# Patient Record
Sex: Female | Born: 1995 | Race: White | Hispanic: No | Marital: Married | Smoking: Former smoker
Health system: Southern US, Community
[De-identification: ages and names within clinical notes are randomized; demographics above are authoritative.]

## PROBLEM LIST (undated history)

## (undated) ENCOUNTER — Inpatient Hospital Stay (HOSPITAL_COMMUNITY): Payer: Self-pay

## (undated) DIAGNOSIS — L409 Psoriasis, unspecified: Secondary | ICD-10-CM

## (undated) DIAGNOSIS — K219 Gastro-esophageal reflux disease without esophagitis: Secondary | ICD-10-CM

## (undated) DIAGNOSIS — K8050 Calculus of bile duct without cholangitis or cholecystitis without obstruction: Secondary | ICD-10-CM

## (undated) DIAGNOSIS — Z789 Other specified health status: Secondary | ICD-10-CM

## (undated) DIAGNOSIS — O1490 Unspecified pre-eclampsia, unspecified trimester: Secondary | ICD-10-CM

## (undated) HISTORY — PX: NO PAST SURGERIES: SHX2092

## (undated) HISTORY — PX: GALLBLADDER SURGERY: SHX652

---
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
## 2014-04-08 DIAGNOSIS — L409 Psoriasis, unspecified: Secondary | ICD-10-CM | POA: Insufficient documentation

## 2014-06-13 ENCOUNTER — Ambulatory Visit: Payer: Self-pay | Admitting: Internal Medicine

## 2014-07-28 ENCOUNTER — Emergency Department: Payer: Self-pay | Admitting: Emergency Medicine

## 2014-08-14 LAB — OB RESULTS CONSOLE GC/CHLAMYDIA
Chlamydia: POSITIVE
Gonorrhea: NEGATIVE

## 2014-08-23 ENCOUNTER — Emergency Department: Payer: Self-pay | Admitting: Emergency Medicine

## 2014-08-23 LAB — COMPREHENSIVE METABOLIC PANEL
ALK PHOS: 51 U/L
ALT: 14 U/L
Albumin: 3.3 g/dL — ABNORMAL LOW (ref 3.8–5.6)
Anion Gap: 9 (ref 7–16)
BUN: 4 mg/dL — AB (ref 7–18)
Bilirubin,Total: 0.5 mg/dL (ref 0.2–1.0)
CHLORIDE: 106 mmol/L (ref 98–107)
CO2: 24 mmol/L (ref 21–32)
Calcium, Total: 8.7 mg/dL — ABNORMAL LOW (ref 9.0–10.7)
Creatinine: 0.58 mg/dL — ABNORMAL LOW (ref 0.60–1.30)
EGFR (Non-African Amer.): >60
GLUCOSE: 78 mg/dL (ref 65–99)
Osmolality: 273 (ref 275–301)
Potassium: 3.4 mmol/L — ABNORMAL LOW (ref 3.5–5.1)
SGOT(AST): 26 U/L (ref 0–26)
Sodium: 139 mmol/L (ref 136–145)
Total Protein: 7.2 g/dL (ref 6.4–8.6)

## 2014-08-23 LAB — CBC WITH DIFFERENTIAL/PLATELET
BASOS PCT: 0.3 %
Basophil #: 0 10*3/uL (ref 0.0–0.1)
EOS PCT: 1.5 %
Eosinophil #: 0.2 10*3/uL (ref 0.0–0.7)
HCT: 37.8 % (ref 35.0–47.0)
HGB: 12.8 g/dL (ref 12.0–16.0)
Lymphocyte #: 1.9 10*3/uL (ref 1.0–3.6)
Lymphocyte %: 18.5 %
MCH: 30.1 pg (ref 26.0–34.0)
MCHC: 33.8 g/dL (ref 32.0–36.0)
MCV: 89 fL (ref 80–100)
Monocyte #: 0.8 x10 3/mm (ref 0.2–0.9)
Monocyte %: 7.7 %
NEUTROS PCT: 72 %
Neutrophil #: 7.5 10*3/uL — ABNORMAL HIGH (ref 1.4–6.5)
Platelet: 233 10*3/uL (ref 150–440)
RBC: 4.25 10*6/uL (ref 3.80–5.20)
RDW: 14 % (ref 11.5–14.5)
WBC: 10.4 10*3/uL (ref 3.6–11.0)

## 2014-08-23 LAB — URINALYSIS, COMPLETE
Bacteria: NONE SEEN
Bilirubin,UR: NEGATIVE
Blood: NEGATIVE
Glucose,UR: NEGATIVE mg/dL (ref 0–75)
KETONE: NEGATIVE
NITRITE: NEGATIVE
PH: 7 (ref 4.5–8.0)
Protein: NEGATIVE
Specific Gravity: 1.002 (ref 1.003–1.030)
Squamous Epithelial: 3

## 2014-08-23 LAB — GC/CHLAMYDIA PROBE AMP

## 2014-08-23 LAB — WET PREP, GENITAL

## 2014-08-23 LAB — HCG, QUANTITATIVE, PREGNANCY: BETA HCG, QUANT.: 100087 m[IU]/mL — AB

## 2014-08-23 NOTE — L&D Delivery Note (Signed)
Operative Delivery Note Patient pushed for 2 hours after she was noted to be C/C/+2.  Discussed with patient performing an operative delivery as she had verbalized exhaustion.Verbal consent: obtained from patient.  Risks and benefits discussed in detail.  Risks include, but are not limited to the risks of anesthesia, bleeding, infection, damage to maternal tissues, fetal cephalhematoma.  There is also the risk of inability to effect vaginal delivery of the head, or shoulder dystocia that cannot be resolved by established maneuvers, leading to the need for emergency cesarean section. Epidural was adequate, foley bulb had been just removed, so bladder was empty.  Baby was noted to be ROT.  Kiwi vacuum placed 3 cm midline from anterior fontanelle. 3 pulls were performed With maternal effort and three pop offs occurred.  An episiotomy was performed and with maternal effort, the baby was born.  At 7:48 AM a viable and healthy female was delivered via Vaginal, Vacuum Investment banker, operational(Extractor).  Presentation: vertex; Position: Left,, Occiput,, Transverse   APGAR: 8, 9; weight 7 lb 9.5 oz (3445 g).   Placenta status: Intact, Spontaneous.   Cord: 3 vessels with the following complications: None.    Anesthesia: Epidural  Instruments: Kiwi vacuum Episiotomy: Median Lacerations: 2nd degree Suture Repair: 2.0 vicryl Est. Blood Loss (mL): 89  Mom to postpartum.  Baby to Couplet care / Skin to Skin.  [name], [name] 03/09/2015, 2:42 PM

## 2014-09-12 LAB — OB RESULTS CONSOLE RUBELLA ANTIBODY, IGM: Rubella: IMMUNE

## 2014-09-12 LAB — OB RESULTS CONSOLE ABO/RH: RH Type: POSITIVE

## 2014-09-12 LAB — OB RESULTS CONSOLE ANTIBODY SCREEN: ANTIBODY SCREEN: NEGATIVE

## 2014-09-12 LAB — OB RESULTS CONSOLE HEPATITIS B SURFACE ANTIGEN: Hepatitis B Surface Ag: NEGATIVE

## 2014-09-12 LAB — OB RESULTS CONSOLE HIV ANTIBODY (ROUTINE TESTING): HIV: NONREACTIVE

## 2014-09-12 LAB — OB RESULTS CONSOLE RPR: RPR: NONREACTIVE

## 2014-11-02 ENCOUNTER — Observation Stay: Payer: Self-pay | Admitting: Obstetrics and Gynecology

## 2014-12-13 ENCOUNTER — Ambulatory Visit
Admit: 2014-12-13 | Disposition: A | Discharge status: Home / Self Care | Payer: Self-pay | Attending: Family Medicine | Admitting: Family Medicine

## 2014-12-13 LAB — RAPID INFLUENZA A&B ANTIGENS

## 2014-12-13 LAB — RAPID STREP-A WITH REFLX: Micro Text Report: NEGATIVE

## 2014-12-16 LAB — BETA STREP CULTURE(ARMC)

## 2014-12-20 ENCOUNTER — Inpatient Hospital Stay (HOSPITAL_COMMUNITY)
Admission: AD | Admit: 2014-12-20 | Discharge: 2014-12-21 | Disposition: A | Discharge status: Home / Self Care | Payer: Medicaid Other | Source: Ambulatory Visit | Attending: Obstetrics | Admitting: Obstetrics

## 2014-12-20 ENCOUNTER — Encounter (HOSPITAL_COMMUNITY): Payer: Self-pay | Admitting: *Deleted

## 2014-12-20 DIAGNOSIS — O219 Vomiting of pregnancy, unspecified: Secondary | ICD-10-CM | POA: Diagnosis not present

## 2014-12-20 DIAGNOSIS — Z3A27 27 weeks gestation of pregnancy: Secondary | ICD-10-CM | POA: Insufficient documentation

## 2014-12-20 DIAGNOSIS — Z87891 Personal history of nicotine dependence: Secondary | ICD-10-CM | POA: Insufficient documentation

## 2014-12-20 DIAGNOSIS — R109 Unspecified abdominal pain: Secondary | ICD-10-CM | POA: Insufficient documentation

## 2014-12-20 DIAGNOSIS — O210 Mild hyperemesis gravidarum: Secondary | ICD-10-CM | POA: Diagnosis not present

## 2014-12-20 DIAGNOSIS — O26899 Other specified pregnancy related conditions, unspecified trimester: Secondary | ICD-10-CM

## 2014-12-20 DIAGNOSIS — Z3A28 28 weeks gestation of pregnancy: Secondary | ICD-10-CM | POA: Diagnosis not present

## 2014-12-20 LAB — URINALYSIS, ROUTINE W REFLEX MICROSCOPIC
Bilirubin Urine: NEGATIVE
GLUCOSE, UA: NEGATIVE mg/dL
Hgb urine dipstick: NEGATIVE
KETONES UR: NEGATIVE mg/dL
NITRITE: NEGATIVE
Protein, ur: NEGATIVE mg/dL
Specific Gravity, Urine: >1.03 — ABNORMAL HIGH (ref 1.005–1.030)
Urobilinogen, UA: 0.2 mg/dL (ref 0.0–1.0)
pH: 6 (ref 5.0–8.0)

## 2014-12-20 LAB — URINE MICROSCOPIC-ADD ON

## 2014-12-20 MED ORDER — LACTATED RINGERS IV SOLN
INTRAVENOUS | Status: DC
  Administered 2014-12-20: 23:00:00 via INTRAVENOUS

## 2014-12-20 MED ORDER — FAMOTIDINE IN NACL 20-0.9 MG/50ML-% IV SOLN
20.0000 mg | Freq: Once | INTRAVENOUS | Status: AC
  Administered 2014-12-20: 20 mg via INTRAVENOUS
  Filled 2014-12-20: qty 50

## 2014-12-20 MED ORDER — PROMETHAZINE HCL 25 MG/ML IJ SOLN
25.0000 mg | Freq: Once | INTRAMUSCULAR | Status: AC
  Administered 2014-12-20: 25 mg via INTRAVENOUS
  Filled 2014-12-20: qty 1

## 2014-12-20 NOTE — MAU Provider Note (Signed)
History     CSN: [id number]  Arrival date and time: 12/20/14 2201   None     Chief Complaint  Patient presents with  . Abdominal Cramping  . Nausea   HPI   Ms Judith Mckinney is a 19 y.o. female who presents with abdominal cramping and N/V that started today. The cramping started at noon; she drank a bottle of water and laid down and felt much better after that. Around 5:00 pm she felt nauseated and has vomited 4-5 times since then. She has not been around anyone who is sick. She has not felt like eating since vomiting, she has been able to drink water. Denies fever. Denies diarrhea. The last time she vomited was 9; she has not taken anything for nausea today. She has had N/V throughout her pregnancy, but not this bad.   + fetal movements Denies vaginal bleeding No intercourse in the last 24 hours.   OB History    Gravida Para Term Preterm AB TAB SAB Ectopic Multiple Living   1                 History reviewed. No pertinent past medical history.  History reviewed. No pertinent past surgical history.  History reviewed. No pertinent family history.  History  Substance Use Topics  . Smoking status: Former Games developer  . Smokeless tobacco: Not on file  . Alcohol Use: No    Allergies:  Allergies  Allergen Reactions  . Neosporin [Neomycin-Bacitracin Zn-Polymyx] Rash    Prescriptions prior to admission  Medication Sig Dispense Refill Last Dose  . Doxylamine-Pyridoxine (DICLEGIS PO) Take by mouth.   12/19/2014 at Unknown time  . Prenatal Vit-Fe Fumarate-FA (PRENATAL MULTIVITAMIN) TABS tablet Take 1 tablet by mouth daily at 12 noon.      Results for orders placed or performed during the hospital encounter of 12/20/14 (from the past 48 hour(s))  Urinalysis, Routine w reflex microscopic     Status: Abnormal   Collection Time: 12/20/14 10:05 PM  Result Value Ref Range   Color, Urine YELLOW YELLOW   APPearance CLEAR CLEAR   Specific Gravity, Urine >1.030 (H) 1.005 - 1.030    pH 6.0 5.0 - 8.0   Glucose, UA NEGATIVE NEGATIVE mg/dL   Hgb urine dipstick NEGATIVE NEGATIVE   Bilirubin Urine NEGATIVE NEGATIVE   Ketones, ur NEGATIVE NEGATIVE mg/dL   Protein, ur NEGATIVE NEGATIVE mg/dL   Urobilinogen, UA 0.2 0.0 - 1.0 mg/dL   Nitrite NEGATIVE NEGATIVE   Leukocytes, UA TRACE (A) NEGATIVE  Urine microscopic-add on     Status: Abnormal   Collection Time: 12/20/14 10:05 PM  Result Value Ref Range   Squamous Epithelial / LPF FEW (A) RARE   WBC, UA 3-6 <3 WBC/hpf   Bacteria, UA FEW (A) RARE   Crystals CA OXALATE CRYSTALS (A) NEGATIVE   Urine-Other MUCOUS PRESENT     Review of Systems  Constitutional: Negative for fever and chills.  Gastrointestinal: Positive for heartburn, nausea, vomiting and abdominal pain (lower abdominal cramping, feels like period cramps). Negative for diarrhea and constipation.  Genitourinary: Negative for dysuria.   Physical Exam   Blood pressure 126/66, pulse 83, temperature 97.5 F (36.4 C), resp. rate 18, height 5' 6.5" (1.689 m), weight 85.186 kg (187 lb 12.8 oz).  Physical Exam  Constitutional: She is oriented to person, place, and time. She appears well-developed and well-nourished.  Non-toxic appearance. She does not have a sickly appearance. She does not appear ill. No distress.  Eyes:  Pupils are equal, round, and reactive to light.  Neck: Neck supple.  Respiratory: Effort normal.  Genitourinary:  Cervix closed, thick, posterior   Musculoskeletal: Normal range of motion.  Neurological: She is alert and oriented to person, place, and time.  Skin: Skin is warm. She is not diaphoretic.  Psychiatric: Her behavior is normal.     Fetal Tracing: Baseline: 140 Variability: moderate  Accelerations: 15x15 Decelerations: none Toco: Quiet   MAU Course  Procedures  None  MDM  1100: Discussed labs, fetal tracing and cervical exam with Dr. Chestine Sporelark who agrees with plan of care.  LR bolus Phenergan 25 mg IV Pepcid 20 mg IV    Assessment and Plan   A:  1. Nausea and vomiting in pregnancy   2. Abdominal cramping affecting pregnancy     P:  Discharge home in stable condition RX: phenergan and Zofran Increase fluid intake Rest Follow up with OB as scheduled, or soon if needed     Duane LopeJennifer I Rasch, NP 12/20/2014 11:08 PM

## 2014-12-20 NOTE — MAU Note (Addendum)
Felt really crampy earlier today. Got better for awhile after drinking water. Feel hot and can't keep myself cool. Seeing spots. Thrown up about 5 times. Feel dehydrated. No diarrhea

## 2014-12-21 MED ORDER — PROMETHAZINE HCL 12.5 MG PO TABS: 12.5000 mg | ORAL_TABLET | Freq: Four times a day (QID) | ORAL | Status: DC | PRN

## 2014-12-21 MED ORDER — ONDANSETRON 4 MG PO TBDP: 4.0000 mg | ORAL_TABLET | Freq: Three times a day (TID) | ORAL | Status: DC | PRN

## 2014-12-31 NOTE — H&P (Signed)
L&D Evaluation:  History:  HPI Pt is a 19 yo G1 at 22.[redacted] weeks GA with an EDC of 03/08/15 [facility] [place], presents to L&D with reports of nausea, vomiting, and dizziness. She reports feeling nauseated and vomiting throughout the pregnancy but reports that she started throwing up bile and felt dizzy so [facility]. She reports +FM, denies vb, lof, ctx, back pain, cva tenderness, urinary frequency. She denies any problems with her pregnancy. She has a history of chlamydia this pregnancy with a negative TOC and was a former smoker but quit upon +HPT.   Presents with nausea/vomiting   Patient's Medical History No Chronic Illness  chlamydia   Patient's Surgical History scope to diagnose a possible torn labrum   Medications Pre Natal Vitamins  phenergan 12.5   Allergies other, neosporin, adhesives   Social History former tobacco   Family History Non-Contributory   ROS:  ROS All systems were reviewed.  HEENT, CNS, GI, GU, Respiratory, CV, Renal and Musculoskeletal systems were found to be normal.   Exam:  Vital Signs stable   General no apparent distress   Mental Status clear   Chest clear   Heart normal sinus rhythm   Abdomen gravid, non-tender   Mebranes Intact   FHT normal rate with no decels, normal for gestational age   Ucx absent   Skin dry, no lesions, no rashes   Lymph no lymphadenopathy   Other UA- negative nitrities, ketones, protein, bood. specific gravity 1.005, 3+ leukocytes, 10 WBCs, trace bacteria, 4 epis   Impression:  Impression UTI, IUP at 22.1 weeks, UTI, nausea and vomiting   Plan:  Plan discharge, abx rx given to pt. Pt told to f/u with OB.   Follow Up Appointment already scheduled   Electronic Signatures: Jannet MantisSubudhi, Ameri Cahoon (CNM)  (Signed 13-Mar-16 03:30)  Authored: L&D Evaluation   Last Updated: 13-Mar-16 03:30 by Jannet MantisSubudhi, Yaritzel Stange (CNM)

## 2015-01-27 ENCOUNTER — Encounter (HOSPITAL_COMMUNITY): Payer: Self-pay | Admitting: *Deleted

## 2015-01-27 ENCOUNTER — Inpatient Hospital Stay (HOSPITAL_COMMUNITY): Payer: Medicaid Other

## 2015-01-27 ENCOUNTER — Inpatient Hospital Stay (HOSPITAL_COMMUNITY)
Admission: AD | Admit: 2015-01-27 | Discharge: 2015-01-27 | Disposition: A | Discharge status: Home / Self Care | Payer: Medicaid Other | Source: Ambulatory Visit | Attending: Obstetrics and Gynecology | Admitting: Obstetrics and Gynecology

## 2015-01-27 DIAGNOSIS — W19XXXA Unspecified fall, initial encounter: Secondary | ICD-10-CM | POA: Diagnosis not present

## 2015-01-27 DIAGNOSIS — Z87891 Personal history of nicotine dependence: Secondary | ICD-10-CM | POA: Insufficient documentation

## 2015-01-27 DIAGNOSIS — R1030 Lower abdominal pain, unspecified: Secondary | ICD-10-CM | POA: Diagnosis present

## 2015-01-27 DIAGNOSIS — O26891 Other specified pregnancy related conditions, first trimester: Secondary | ICD-10-CM

## 2015-01-27 DIAGNOSIS — O9989 Other specified diseases and conditions complicating pregnancy, childbirth and the puerperium: Secondary | ICD-10-CM | POA: Diagnosis not present

## 2015-01-27 DIAGNOSIS — W109XXA Fall (on) (from) unspecified stairs and steps, initial encounter: Secondary | ICD-10-CM | POA: Diagnosis not present

## 2015-01-27 DIAGNOSIS — Z3A33 33 weeks gestation of pregnancy: Secondary | ICD-10-CM | POA: Insufficient documentation

## 2015-01-27 HISTORY — DX: Psoriasis, unspecified: L40.9

## 2015-01-27 NOTE — MAU Provider Note (Signed)
History     CSN: [id number]  Arrival date and time: 01/27/15 2056   First Provider Initiated Contact with Patient 01/27/15 2211      No chief complaint on file.  HPI Comments: Judith Mckinney is a 19 y.o. G1P0 at 2788w4d who presents today after a fall. She states that around 1930 she slipped while going down two steps and landed on her buttocks. She reports a "few random twinges of lower abdominal pain". She denies any vaginal bleeding or LOF. She states that initially the fetus had not moved much since the fall. She states that since being here, and on the monitor she has been feeling normal fetal movement. Her next appointment is on 02/05/15. She is being seen tomorrow for labs for cholestasis due to itching on her hands and feet.   Fall The accident occurred 1 to 3 hours ago. The fall occurred while walking (down two steps ). She fell from a height of 3 to 5 ft. She landed on hard floor. There was no blood loss. The point of impact was the buttocks (possibly hit back too). Pertinent negatives include no abdominal pain, nausea or vomiting. She has tried nothing for the symptoms.    Past Medical History  Diagnosis Date  . Psoriasis     History reviewed. No pertinent past surgical history.  History reviewed. No pertinent family history.  History  Substance Use Topics  . Smoking status: Former Games developermoker  . Smokeless tobacco: Not on file  . Alcohol Use: No    Allergies:  Allergies  Allergen Reactions  . Neosporin [Neomycin-Bacitracin Zn-Polymyx] Rash    Prescriptions prior to admission  Medication Sig Dispense Refill Last Dose  . calcium carbonate (TUMS - DOSED IN MG ELEMENTAL CALCIUM) 500 MG chewable tablet Chew 1-2 tablets by mouth daily as needed for heartburn.   Past Week at Unknown time  . Prenatal Vit-Fe Fumarate-FA (PRENATAL MULTIVITAMIN) TABS tablet Take 1 tablet by mouth daily at 12 noon.   01/27/2015 at Unknown time  . promethazine (PHENERGAN) 12.5 MG tablet Take 1 tablet  (12.5 mg total) by mouth every 6 (six) hours as needed for nausea or vomiting. 30 tablet 0 Past Week at Unknown time  . ondansetron (ZOFRAN ODT) 4 MG disintegrating tablet Take 1 tablet (4 mg total) by mouth every 8 (eight) hours as needed for nausea or vomiting. (Patient not taking: Reported on 01/27/2015) 20 tablet 0 Not Taking at Unknown time    Review of Systems  Gastrointestinal: Negative for nausea, vomiting, abdominal pain, diarrhea and constipation.  Musculoskeletal: Positive for falls. Negative for back pain.  Neurological: Negative for dizziness.   Physical Exam   Blood pressure 129/72, pulse 77, temperature 98.5 F (36.9 C), temperature source Oral, resp. rate 16, height 5\' 6"  (1.676 m), weight 88.905 kg (196 lb), SpO2 99 %.  Physical Exam  Nursing note and vitals reviewed. Constitutional: She is oriented to person, place, and time. She appears well-developed and well-nourished. No distress.  Cardiovascular: Normal rate.   Respiratory: Effort normal.  GI: Soft. There is no tenderness. There is no rebound.  Neurological: She is alert and oriented to person, place, and time.  Skin: Skin is warm and dry.  Psychiatric: She has a normal mood and affect.   FHT 145, moderate with 15x15 accels, no decels Toco: rare UC  MAU Course  Procedures  MDM US: NO previa or abruption identified, normal AFI  2326: d/w Dr. Dareen PianoAnderson, ok for dc home.   Assessment  and Plan   1. Fall, initial encounter   2. [redacted] weeks gestation of pregnancy    DC home Comfort measures reviewed  3rd Trimester precautions  PTL precautions  Fetal kick counts RX: none  Return to MAU as needed FU with OB as planned  Follow-up Information    Follow up with Levi Aland, MD.   Specialty:  Obstetrics and Gynecology   Why:  As scheduled   Contact information:   [address] [place] [address] [phone]         Judith Mckinney 01/27/2015, 10:13 PM

## 2015-01-27 NOTE — Discharge Instructions (Signed)
Third Trimester of Pregnancy The third trimester is from week 29 through week 42, months 7 through 9. The third trimester is a time when the fetus is growing rapidly. At the end of the ninth month, the fetus is about 20 inches in length and weighs 6-10 pounds.  BODY CHANGES Your body goes through many changes during pregnancy. The changes vary from woman to woman.   Your weight will continue to increase. You can expect to gain 25-35 pounds (11-16 kg) by the end of the pregnancy.  You may begin to get stretch marks on your hips, abdomen, and breasts.  You may urinate more often because the fetus is moving lower into your pelvis and pressing on your bladder.  You may develop or continue to have heartburn as a result of your pregnancy.  You may develop constipation because certain hormones are causing the muscles that push waste through your intestines to slow down.  You may develop hemorrhoids or swollen, bulging veins (varicose veins).  You may have pelvic pain because of the weight gain and pregnancy hormones relaxing your joints between the bones in your pelvis. Backaches may result from overexertion of the muscles supporting your posture.  You may have changes in your hair. These can include thickening of your hair, rapid growth, and changes in texture. Some women also have hair loss during or after pregnancy, or hair that feels dry or thin. Your hair will most likely return to normal after your baby is born.  Your breasts will continue to grow and be tender. A yellow discharge may leak from your breasts called colostrum.  Your belly button may stick out.  You may feel short of breath because of your expanding uterus.  You may notice the fetus "dropping," or moving lower in your abdomen.  You may have a bloody mucus discharge. This usually occurs a few days to a week before labor begins.  Your cervix becomes thin and soft (effaced) near your due date. WHAT TO EXPECT AT YOUR PRENATAL  EXAMS  You will have prenatal exams every 2 weeks until week 36. Then, you will have weekly prenatal exams. During a routine prenatal visit:  You will be weighed to make sure you and the fetus are growing normally.  Your blood pressure is taken.  Your abdomen will be measured to track your baby's growth.  The fetal heartbeat will be listened to.  Any test results from the previous visit will be discussed.  You may have a cervical check near your due date to see if you have effaced. At around 36 weeks, your caregiver will check your cervix. At the same time, your caregiver will also perform a test on the secretions of the vaginal tissue. This test is to determine if a type of bacteria, Group B streptococcus, is present. Your caregiver will explain this further. Your caregiver may ask you:  What your birth plan is.  How you are feeling.  If you are feeling the baby move.  If you have had any abnormal symptoms, such as leaking fluid, bleeding, severe headaches, or abdominal cramping.  If you have any questions. Other tests or screenings that may be performed during your third trimester include:  Blood tests that check for low iron levels (anemia).  Fetal testing to check the health, activity level, and growth of the fetus. Testing is done if you have certain medical conditions or if there are problems during the pregnancy. FALSE LABOR You may feel small, irregular contractions that   eventually go away. These are called Braxton Hicks contractions, or false labor. Contractions may last for hours, days, or even weeks before true labor sets in. If contractions come at regular intervals, intensify, or become painful, it is best to be seen by your caregiver.  SIGNS OF LABOR   Menstrual-like cramps.  Contractions that are 5 minutes apart or less.  Contractions that start on the top of the uterus and spread down to the lower abdomen and back.  A sense of increased pelvic pressure or back  pain.  A watery or bloody mucus discharge that comes from the vagina. If you have any of these signs before the 37th week of pregnancy, call your caregiver right away. You need to go to the hospital to get checked immediately. HOME CARE INSTRUCTIONS   Avoid all smoking, herbs, alcohol, and unprescribed drugs. These chemicals affect the formation and growth of the baby.  Follow your caregiver's instructions regarding medicine use. There are medicines that are either safe or unsafe to take during pregnancy.  Exercise only as directed by your caregiver. Experiencing uterine cramps is a good sign to stop exercising.  Continue to eat regular, healthy meals.  Wear a good support bra for breast tenderness.  Do not use hot tubs, steam rooms, or saunas.  Wear your seat belt at all times when driving.  Avoid raw meat, uncooked cheese, cat litter boxes, and soil used by cats. These carry germs that can cause birth defects in the baby.  Take your prenatal vitamins.  Try taking a stool softener (if your caregiver approves) if you develop constipation. Eat more high-fiber foods, such as fresh vegetables or fruit and whole grains. Drink plenty of fluids to keep your urine clear or pale yellow.  Take warm sitz baths to soothe any pain or discomfort caused by hemorrhoids. Use hemorrhoid cream if your caregiver approves.  If you develop varicose veins, wear support hose. Elevate your feet for 15 minutes, 3-4 times a day. Limit salt in your diet.  Avoid heavy lifting, wear low heal shoes, and practice good posture.  Rest a lot with your legs elevated if you have leg cramps or low back pain.  Visit your dentist if you have not gone during your pregnancy. Use a soft toothbrush to brush your teeth and be gentle when you floss.  A sexual relationship may be continued unless your caregiver directs you otherwise.  Do not travel far distances unless it is absolutely necessary and only with the approval  of your caregiver.  Take prenatal classes to understand, practice, and ask questions about the labor and delivery.  Make a trial run to the hospital.  Pack your hospital bag.  Prepare the baby's nursery.  Continue to go to all your prenatal visits as directed by your caregiver. SEEK MEDICAL CARE IF:  You are unsure if you are in labor or if your water has broken.  You have dizziness.  You have mild pelvic cramps, pelvic pressure, or nagging pain in your abdominal area.  You have persistent nausea, vomiting, or diarrhea.  You have a bad smelling vaginal discharge.  You have pain with urination. SEEK IMMEDIATE MEDICAL CARE IF:   You have a fever.  You are leaking fluid from your vagina.  You have spotting or bleeding from your vagina.  You have severe abdominal cramping or pain.  You have rapid weight loss or gain.  You have shortness of breath with chest pain.  You notice sudden or extreme swelling   of your face, hands, ankles, feet, or legs.  You have not felt your baby move in over an hour.  You have severe headaches that do not go away with medicine.  You have vision changes. Document Released: 08/03/2001 Document Revised: 08/14/2013 Document Reviewed: 10/10/2012 Exit[employer] Patient Information 2015 [employer]. This information is not intended to replace advice given to you by your health care provider. Make sure you discuss any questions you have with your health care provider.  

## 2015-01-27 NOTE — MAU Note (Signed)
Pt reports she slipped down the steps on her butt, did not hit her abd at 1930 tonight. Decreased fetal movement and some lower abd pressure.

## 2015-01-28 DIAGNOSIS — W19XXXA Unspecified fall, initial encounter: Secondary | ICD-10-CM | POA: Insufficient documentation

## 2015-01-28 DIAGNOSIS — Z3A33 33 weeks gestation of pregnancy: Secondary | ICD-10-CM | POA: Insufficient documentation

## 2015-02-10 LAB — OB RESULTS CONSOLE GC/CHLAMYDIA
Chlamydia: NEGATIVE
Gonorrhea: NEGATIVE

## 2015-02-14 ENCOUNTER — Inpatient Hospital Stay (HOSPITAL_COMMUNITY)
Admission: AD | Admit: 2015-02-14 | Discharge: 2015-02-14 | Disposition: A | Discharge status: Home / Self Care | Payer: Medicaid Other | Source: Ambulatory Visit | Attending: Obstetrics and Gynecology | Admitting: Obstetrics and Gynecology

## 2015-02-14 ENCOUNTER — Encounter (HOSPITAL_COMMUNITY): Payer: Self-pay | Admitting: *Deleted

## 2015-02-14 DIAGNOSIS — Z3A36 36 weeks gestation of pregnancy: Secondary | ICD-10-CM | POA: Diagnosis not present

## 2015-02-14 DIAGNOSIS — M791 Myalgia: Secondary | ICD-10-CM | POA: Insufficient documentation

## 2015-02-14 DIAGNOSIS — M549 Dorsalgia, unspecified: Secondary | ICD-10-CM

## 2015-02-14 DIAGNOSIS — O368131 Decreased fetal movements, third trimester, fetus 1: Secondary | ICD-10-CM

## 2015-02-14 DIAGNOSIS — O26893 Other specified pregnancy related conditions, third trimester: Secondary | ICD-10-CM

## 2015-02-14 DIAGNOSIS — M545 Low back pain: Secondary | ICD-10-CM | POA: Insufficient documentation

## 2015-02-14 DIAGNOSIS — M7918 Myalgia, other site: Secondary | ICD-10-CM

## 2015-02-14 DIAGNOSIS — O368130 Decreased fetal movements, third trimester, not applicable or unspecified: Secondary | ICD-10-CM | POA: Insufficient documentation

## 2015-02-14 DIAGNOSIS — Z87891 Personal history of nicotine dependence: Secondary | ICD-10-CM | POA: Diagnosis not present

## 2015-02-14 DIAGNOSIS — O9989 Other specified diseases and conditions complicating pregnancy, childbirth and the puerperium: Secondary | ICD-10-CM

## 2015-02-14 LAB — URINALYSIS, ROUTINE W REFLEX MICROSCOPIC
Bilirubin Urine: NEGATIVE
GLUCOSE, UA: NEGATIVE mg/dL
Hgb urine dipstick: NEGATIVE
KETONES UR: NEGATIVE mg/dL
Nitrite: NEGATIVE
PROTEIN: NEGATIVE mg/dL
Specific Gravity, Urine: 1.01 (ref 1.005–1.030)
Urobilinogen, UA: 0.2 mg/dL (ref 0.0–1.0)
pH: 6 (ref 5.0–8.0)

## 2015-02-14 LAB — URINE MICROSCOPIC-ADD ON

## 2015-02-14 MED ORDER — CYCLOBENZAPRINE HCL 5 MG PO TABS: 5.0000 mg | ORAL_TABLET | Freq: Three times a day (TID) | ORAL | Status: DC | PRN

## 2015-02-14 MED ORDER — CYCLOBENZAPRINE HCL 5 MG PO TABS
5.0000 mg | ORAL_TABLET | Freq: Once | ORAL | Status: AC
  Administered 2015-02-14: 5 mg via ORAL
  Filled 2015-02-14: qty 1

## 2015-02-14 NOTE — MAU Provider Note (Signed)
Chief Complaint:  Back Pain   First Provider Initiated Contact with Patient 02/14/15 2023     HPI: Judith Mckinney is a 19 y.o. G1P0 at [redacted]w[redacted]d who presents to maternity admissions reporting:  1. Mild, constant low back pain since last night 2. Intermittent, sharp bilateral low back that shoots down the back of both legs today that she rates 8/10 pain scale when it occurs.  3. Cramping similar to menstrual period 4. Decreased fetal movement today, but very active baby since arriving to MAU.  No relief of pain with Tylenol. Low back pain shooting down backs of legs is worse with ambulation. Unsure if she is having contractions. Denies leakage of fluid or vaginal bleeding. Good fetal movement.   Past Medical History: Past Medical History  Diagnosis Date  . Psoriasis     Past obstetric history: OB History  Gravida Para Term Preterm AB SAB TAB Ectopic Multiple Living  1             # Outcome Date GA Lbr Len/2nd Weight Sex Delivery Anes PTL Lv  1 Current               Past Surgical History: History reviewed. No pertinent past surgical history.   Family History: History reviewed. No pertinent family history.  Social History: History  Substance Use Topics  . Smoking status: Former Games developer  . Smokeless tobacco: Not on file  . Alcohol Use: No    Allergies:  Allergies  Allergen Reactions  . Neosporin [Neomycin-Bacitracin Zn-Polymyx] Rash    Meds:  No prescriptions prior to admission   ROS:  Review of Systems  Constitutional: Negative for fever and chills.  Gastrointestinal: Negative for nausea, vomiting, abdominal pain, diarrhea and constipation.  Genitourinary: Negative for dysuria, urgency, frequency, hematuria and flank pain.  Musculoskeletal: Positive for back pain. Negative for neck pain.  Neurological: Negative for focal weakness.   Physical Exam  Blood pressure 128/68, pulse 77, temperature 98 F (36.7 C), temperature source Oral, resp. rate 18, weight 201 lb  (91.173 kg).  GENERAL: Well-developed, well-nourished female in mild distress.  HEART: normal rate RESP: normal effort GI: Abd soft, non-tender, gravid appropriate for gestational age. MS: Extremities nontender, no edema, normal ROM. Low back nontender. Normal range of motion. Negative straight leg raise test. NEURO: Alert and oriented x 4.  GU: NEFG, physiologic discharge, no blood. No CVAT Dilation: Closed (outer os 1) Exam by:: Dorathy Kinsman, CNM  FHT:  Baseline 150 , moderate variability, accelerations present, no decelerations Contractions: UI   Labs: Results for orders placed or performed during the hospital encounter of 02/14/15 (from the past 24 hour(s))  Urinalysis, Routine w reflex microscopic (not at [facility])     Status: Abnormal   Collection Time: 02/14/15  7:32 PM  Result Value Ref Range   Color, Urine YELLOW YELLOW   APPearance CLEAR CLEAR   Specific Gravity, Urine 1.010 1.005 - 1.030   pH 6.0 5.0 - 8.0   Glucose, UA NEGATIVE NEGATIVE mg/dL   Hgb urine dipstick NEGATIVE NEGATIVE   Bilirubin Urine NEGATIVE NEGATIVE   Ketones, ur NEGATIVE NEGATIVE mg/dL   Protein, ur NEGATIVE NEGATIVE mg/dL   Urobilinogen, UA 0.2 0.0 - 1.0 mg/dL   Nitrite NEGATIVE NEGATIVE   Leukocytes, UA TRACE (A) NEGATIVE  Urine microscopic-add on     Status: Abnormal   Collection Time: 02/14/15  7:32 PM  Result Value Ref Range   Squamous Epithelial / LPF RARE RARE   WBC, UA 0-2 <  3 WBC/hpf   Bacteria, UA FEW (A) RARE    Imaging:  NA  MAU Course: Suspect low back pain may be combination of baby's position, muscular low back pain and SI instability. Will try ice and Flexeril. NST reactive. Patient feeling frequent fetal movement while in MAU.  Pain improve significantly. Now 3/10 pain scale.   Assessment: 1. Back pain affecting pregnancy in third trimester   2. Musculoskeletal pain   3. Decreased fetal movement in pregnancy, antepartum, third trimester, fetus 1     Plan: Discharge  home in stable condition.  Comfort measures. Encourage for a bleeding positions and exaggerated sims to encourage baby to rotate to OA. Labor precautions and fetal kick counts     Follow-up Information    Follow up with Philip Aspen, DO On 02/14/2015.   Specialty:  Obstetrics and Gynecology   Why:  Next week as scheduled for prenatal visit or sooner as needed if symptoms worsen   Contact information:   [address] [place] [address] [phone]       Follow up with [facility] [place] [facility].   Why:  As needed in emergencies   Contact information:   [address] [id number] mc [place] [address] [phone]       Medication List    TAKE these medications        calcium carbonate 500 MG chewable tablet  Commonly known as:  TUMS - dosed in mg elemental calcium  Chew 1-2 tablets by mouth daily as needed for heartburn.     cyclobenzaprine 5 MG tablet  Commonly known as:  FLEXERIL  Take 1-2 tablets (5-10 mg total) by mouth 3 (three) times daily as needed for muscle spasms.     diphenhydrAMINE 2 % cream  Commonly known as:  BENADRYL  Apply 1 application topically 3 (three) times daily as needed for itching.     prenatal multivitamin Tabs tablet  Take 1 tablet by mouth daily at 12 noon.     promethazine 12.5 MG tablet  Commonly known as:  PHENERGAN  Take 1 tablet (12.5 mg total) by mouth every 6 (six) hours as needed for nausea or vomiting.        [place], [place] 02/14/2015 10:04 PM

## 2015-02-14 NOTE — Discharge Instructions (Signed)
Back Pain in Pregnancy Back pain during pregnancy is common. It happens in about half of all pregnancies. It is important for you and your baby that you remain active during your pregnancy.If you feel that back pain is not allowing you to remain active or sleep well, it is time to see your caregiver. Back pain may be caused by several factors related to changes during your pregnancy.Fortunately, unless you had trouble with your back before your pregnancy, the pain is likely to get better after you deliver. Low back pain usually occurs between the fifth and seventh months of pregnancy. It can, however, happen in the first couple months. Factors that increase the risk of back problems include:   Previous back problems.  Injury to your back.  Having twins or multiple births.  A chronic cough.  Stress.  Job-related repetitive motions.  Muscle or spinal disease in the back.  Family history of back problems, ruptured (herniated) discs, or osteoporosis.  Depression, anxiety, and panic attacks. CAUSES  1. When you are pregnant, your body produces a hormone called relaxin. This hormonemakes the ligaments connecting the low back and pubic bones more flexible. This flexibility allows the baby to be delivered more easily. When your ligaments are loose, your muscles need to work harder to support your back. Soreness in your back can come from tired muscles. Soreness can also come from back tissues that are irritated since they are receiving less support. 2. As the baby grows, it puts pressure on the nerves and blood vessels in your pelvis. This can cause back pain. 3. As the baby grows and gets heavier during pregnancy, the uterus pushes the stomach muscles forward and changes your center of gravity. This makes your back muscles work harder to maintain good posture. SYMPTOMS  Lumbar pain during pregnancy Lumbar pain during pregnancy usually occurs at or above the waist in the center of the back.  There may be pain and numbness that radiates into your leg or foot. This is similar to low back pain experienced by non-pregnant women. It usually increases with sitting for long periods of time, standing, or repetitive lifting. Tenderness may also be present in the muscles along your upper back. Posterior pelvic pain during pregnancy Pain in the back of the pelvis is more common than lumbar pain in pregnancy. It is a deep pain felt in your side at the waistline, or across the tailbone (sacrum), or in both places. You may have pain on one or both sides. This pain can also go into the buttocks and backs of the upper thighs. Pubic and groin pain may also be present. The pain does not quickly resolve with rest, and morning stiffness may also be present. Pelvic pain during pregnancy can be brought on by most activities. A high level of fitness before and during pregnancy may or may not prevent this problem. Labor pain is usually 1 to 2 minutes apart, lasts for about 1 minute, and involves a bearing down feeling or pressure in your pelvis. However, if you are at term with the pregnancy, constant low back pain can be the beginning of early labor, and you should be aware of this. DIAGNOSIS  X-rays of the back should not be done during the first 12 to 14 weeks of the pregnancy and only when absolutely necessary during the rest of the pregnancy. MRIs do not give off radiation and are safe during pregnancy. MRIs also should only be done when absolutely necessary. HOME CARE INSTRUCTIONS  Exercise  as directed by your caregiver. Exercise is the most effective way to prevent or manage back pain. If you have a back problem, it is especially important to avoid sports that require sudden body movements. Swimming and walking are great activities.  Do not stand in one place for long periods of time.  Do not wear high heels.  Sit in chairs with good posture. Use a pillow on your lower back if necessary. Make sure your  head rests over your shoulders and is not hanging forward.  Try sleeping on your side, preferably the left side, with a pillow or two between your legs. If you are sore after a night's rest, your bedmay betoo soft.Try placing a board between your mattress and box spring.  Listen to your body when lifting.If you are experiencing pain, ask for help or try bending yourknees more so you can use your leg muscles rather than your back muscles. Squat down when picking up something from the floor. Do not bend over.  Eat a healthy diet. Try to gain weight within your caregiver's recommendations.  Use heat or cold packs 3 to 4 times a day for 15 minutes to help with the pain.  Only take over-the-counter or prescription medicines for pain, discomfort, or fever as directed by your caregiver. Sudden (acute) back pain  Use bed rest for only the most extreme, acute episodes of back pain. Prolonged bed rest over 48 hours will aggravate your condition.  Ice is very effective for acute conditions.  Put ice in a plastic bag.  Place a towel between your skin and the bag.  Leave the ice on for 10 to 20 minutes every 2 hours, or as needed.  Using heat packs for 30 minutes prior to activities is also helpful. Continued back pain See your caregiver if you have continued problems. Your caregiver can help or refer you for appropriate physical therapy. With conditioning, most back problems can be avoided. Sometimes, a more serious issue may be the cause of back pain. You should be seen right away if new problems seem to be developing. Your caregiver may recommend:  A maternity girdle.  An elastic sling.  A back brace.  A massage therapist or acupuncture. SEEK MEDICAL CARE IF:   You are not able to do most of your daily activities, even when taking the pain medicine you were given.  You need a referral to a physical therapist or chiropractor.  You want to try acupuncture. SEEK IMMEDIATE MEDICAL  CARE IF:  You develop numbness, tingling, weakness, or problems with the use of your arms or legs.  You develop severe back pain that is no longer relieved with medicines.  You have a sudden change in bowel or bladder control.  You have increasing pain in other areas of the body.  You develop shortness of breath, dizziness, or fainting.  You develop nausea, vomiting, or sweating.  You have back pain which is similar to labor pains.  You have back pain along with your water breaking or vaginal bleeding.  You have back pain or numbness that travels down your leg.  Your back pain developed after you fell.  You develop pain on one side of your back. You may have a kidney stone.  You see blood in your urine. You may have a bladder infection or kidney stone.  You have back pain with blisters. You may have shingles. Back pain is fairly common during pregnancy but should not be accepted as just part of  the process. Back pain should always be treated as soon as possible. This will make your pregnancy as pleasant as possible. Document Released: 11/17/2005 Document Revised: 11/01/2011 Document Reviewed: 12/29/2010 [facility] Patient Information 2015 [place], [place]. This information is not intended to replace advice given to you by your health care provider. Make sure you discuss any questions you have with your health care provider.  Braxton Hicks Contractions Contractions of the uterus can occur throughout pregnancy. Contractions are not always a sign that you are in labor.  WHAT ARE BRAXTON HICKS CONTRACTIONS?  Contractions that occur before labor are called Braxton Hicks contractions, or false labor. Toward the end of pregnancy (32-34 weeks), these contractions can develop more often and may become more forceful. This is not true labor because these contractions do not result in opening (dilatation) and thinning of the cervix. They are sometimes difficult to tell apart from true labor  because these contractions can be forceful and people have different pain tolerances. You should not feel embarrassed if you go to the hospital with false labor. Sometimes, the only way to tell if you are in true labor is for your health care provider to look for changes in the cervix. If there are no prenatal problems or other health problems associated with the pregnancy, it is completely safe to be sent home with false labor and await the onset of true labor. HOW CAN YOU TELL THE DIFFERENCE BETWEEN TRUE AND FALSE LABOR? False Labor  The contractions of false labor are usually shorter and not as hard as those of true labor.   The contractions are usually irregular.   The contractions are often felt in the front of the lower abdomen and in the groin.   The contractions may go away when you walk around or change positions while lying down.   The contractions get weaker and are shorter lasting as time goes on.   The contractions do not usually become progressively stronger, regular, and closer together as with true labor.  True Labor 4. Contractions in true labor last 30-70 seconds, become very regular, usually become more intense, and increase in frequency.  5. The contractions do not go away with walking.  6. The discomfort is usually felt in the top of the uterus and spreads to the lower abdomen and low back.  7. True labor can be determined by your health care provider with an exam. This will show that the cervix is dilating and getting thinner.  WHAT TO REMEMBER  Keep up with your usual exercises and follow other instructions given by your health care provider.   Take medicines as directed by your health care provider.   Keep your regular prenatal appointments.   Eat and drink lightly if you think you are going into labor.   If Braxton Hicks contractions are making you uncomfortable:   Change your position from lying down or resting to walking, or from walking to  resting.   Sit and rest in a tub of warm water.   Drink 2-3 glasses of water. Dehydration may cause these contractions.   Do slow and deep breathing several times an hour.  WHEN SHOULD I SEEK IMMEDIATE MEDICAL CARE? Seek immediate medical care if:  Your contractions become stronger, more regular, and closer together.   You have fluid leaking or gushing from your vagina.   You have a fever.   You pass blood-tinged mucus.   You have vaginal bleeding.   You have continuous abdominal pain.  You have low back pain that you never had before.   You feel your baby's head pushing down and causing pelvic pressure.   Your baby is not moving as much as it used to.  Document Released: 08/09/2005 Document Revised: 08/14/2013 Document Reviewed: 05/21/2013 [facility] Patient Information 2015 [place], [place]. This information is not intended to replace advice given to you by your health care provider. Make sure you discuss any questions you have with your health care provider.  Fetal Movement Counts Patient Name: __________________________________________________ Patient Due Date: ____________________ Performing a fetal movement count is highly recommended in high-risk pregnancies, but it is good for every pregnant woman to do. Your health care provider may ask you to start counting fetal movements at 28 weeks of the pregnancy. Fetal movements often increase:  After eating a full meal.  After physical activity.  After eating or drinking something sweet or cold.  At rest. Pay attention to when you feel the baby is most active. This will help you notice a pattern of your baby's sleep and wake cycles and what factors contribute to an increase in fetal movement. It is important to perform a fetal movement count at the same time each day when your baby is normally most active.  HOW TO COUNT FETAL MOVEMENTS 8. Find a quiet and comfortable area to sit or lie down on your left side.  Lying on your left side provides the best blood and oxygen circulation to your baby. 9. Write down the day and time on a sheet of paper or in a journal. 10. Start counting kicks, flutters, swishes, rolls, or jabs in a 2-hour period. You should feel at least 10 movements within 2 hours. 11. If you do not feel 10 movements in 2 hours, wait 2-3 hours and count again. Look for a change in the pattern or not enough counts in 2 hours. SEEK MEDICAL CARE IF:  You feel less than 10 counts in 2 hours, tried twice.  There is no movement in over an hour.  The pattern is changing or taking longer each day to reach 10 counts in 2 hours.  You feel the baby is not moving as he or she usually does. Date: ____________ Movements: ____________ Start time: ____________ Doreatha Martin time: ____________  Date: ____________ Movements: ____________ Start time: ____________ Doreatha Martin time: ____________ Date: ____________ Movements: ____________ Start time: ____________ Doreatha Martin time: ____________ Date: ____________ Movements: ____________ Start time: ____________ Doreatha Martin time: ____________ Date: ____________ Movements: ____________ Start time: ____________ Doreatha Martin time: ____________ Date: ____________ Movements: ____________ Start time: ____________ Doreatha Martin time: ____________ Date: ____________ Movements: ____________ Start time: ____________ Doreatha Martin time: ____________ Date: ____________ Movements: ____________ Start time: ____________ Doreatha Martin time: ____________  Date: ____________ Movements: ____________ Start time: ____________ Doreatha Martin time: ____________ Date: ____________ Movements: ____________ Start time: ____________ Doreatha Martin time: ____________ Date: ____________ Movements: ____________ Start time: ____________ Doreatha Martin time: ____________ Date: ____________ Movements: ____________ Start time: ____________ Doreatha Martin time: ____________ Date: ____________ Movements: ____________ Start time: ____________ Doreatha Martin time: ____________ Date:  ____________ Movements: ____________ Start time: ____________ Doreatha Martin time: ____________ Date: ____________ Movements: ____________ Start time: ____________ Doreatha Martin time: ____________  Date: ____________ Movements: ____________ Start time: ____________ Doreatha Martin time: ____________ Date: ____________ Movements: ____________ Start time: ____________ Doreatha Martin time: ____________ Date: ____________ Movements: ____________ Start time: ____________ Doreatha Martin time: ____________ Date: ____________ Movements: ____________ Start time: ____________ Doreatha Martin time: ____________ Date: ____________ Movements: ____________ Start time: ____________ Doreatha Martin time: ____________ Date: ____________ Movements: ____________ Start time: ____________ Doreatha Martin time: ____________ Date: ____________ Movements: ____________ Start time: ____________ Doreatha Martin  time: ____________  Date: ____________ Movements: ____________ Start time: ____________ Doreatha Martin time: ____________ Date: ____________ Movements: ____________ Start time: ____________ Doreatha Martin time: ____________ Date: ____________ Movements: ____________ Start time: ____________ Doreatha Martin time: ____________ Date: ____________ Movements: ____________ Start time: ____________ Doreatha Martin time: ____________ Date: ____________ Movements: ____________ Start time: ____________ Doreatha Martin time: ____________ Date: ____________ Movements: ____________ Start time: ____________ Doreatha Martin time: ____________ Date: ____________ Movements: ____________ Start time: ____________ Doreatha Martin time: ____________  Date: ____________ Movements: ____________ Start time: ____________ Doreatha Martin time: ____________ Date: ____________ Movements: ____________ Start time: ____________ Doreatha Martin time: ____________ Date: ____________ Movements: ____________ Start time: ____________ Doreatha Martin time: ____________ Date: ____________ Movements: ____________ Start time: ____________ Doreatha Martin time: ____________ Date: ____________ Movements: ____________ Start  time: ____________ Doreatha Martin time: ____________ Date: ____________ Movements: ____________ Start time: ____________ Doreatha Martin time: ____________ Date: ____________ Movements: ____________ Start time: ____________ Doreatha Martin time: ____________  Date: ____________ Movements: ____________ Start time: ____________ Doreatha Martin time: ____________ Date: ____________ Movements: ____________ Start time: ____________ Doreatha Martin time: ____________ Date: ____________ Movements: ____________ Start time: ____________ Doreatha Martin time: ____________ Date: ____________ Movements: ____________ Start time: ____________ Doreatha Martin time: ____________ Date: ____________ Movements: ____________ Start time: ____________ Doreatha Martin time: ____________ Date: ____________ Movements: ____________ Start time: ____________ Doreatha Martin time: ____________ Date: ____________ Movements: ____________ Start time: ____________ Doreatha Martin time: ____________  Date: ____________ Movements: ____________ Start time: ____________ Doreatha Martin time: ____________ Date: ____________ Movements: ____________ Start time: ____________ Doreatha Martin time: ____________ Date: ____________ Movements: ____________ Start time: ____________ Doreatha Martin time: ____________ Date: ____________ Movements: ____________ Start time: ____________ Doreatha Martin time: ____________ Date: ____________ Movements: ____________ Start time: ____________ Doreatha Martin time: ____________ Date: ____________ Movements: ____________ Start time: ____________ Doreatha Martin time: ____________ Date: ____________ Movements: ____________ Start time: ____________ Doreatha Martin time: ____________  Date: ____________ Movements: ____________ Start time: ____________ Doreatha Martin time: ____________ Date: ____________ Movements: ____________ Start time: ____________ Doreatha Martin time: ____________ Date: ____________ Movements: ____________ Start time: ____________ Doreatha Martin time: ____________ Date: ____________ Movements: ____________ Start time: ____________ Doreatha Martin time:  ____________ Date: ____________ Movements: ____________ Start time: ____________ Doreatha Martin time: ____________ Date: ____________ Movements: ____________ Start time: ____________ Doreatha Martin time: ____________ Document Released: 09/08/2006 Document Revised: 12/24/2013 Document Reviewed: 06/05/2012 [employer] Patient Information 2015 [place]. This information is not intended to replace advice given to you by your health care provider. Make sure you discuss any questions you have with your health care provider.

## 2015-02-14 NOTE — MAU Note (Signed)
Patient c/o constant back pain starting last night and has had sharp twinges of pain on both sides intermittently starting today.  Denies LOF or vaginal bleeding. States baby not moving as much today as normal.

## 2015-02-16 LAB — OB RESULTS CONSOLE GBS: GBS: NEGATIVE

## 2015-02-27 ENCOUNTER — Inpatient Hospital Stay (HOSPITAL_COMMUNITY)
Admission: AD | Admit: 2015-02-27 | Discharge: 2015-02-27 | Disposition: A | Discharge status: Home / Self Care | Payer: Medicaid Other | Source: Ambulatory Visit | Attending: Obstetrics and Gynecology | Admitting: Obstetrics and Gynecology

## 2015-02-27 ENCOUNTER — Encounter (HOSPITAL_COMMUNITY): Payer: Self-pay

## 2015-02-27 DIAGNOSIS — O9989 Other specified diseases and conditions complicating pregnancy, childbirth and the puerperium: Secondary | ICD-10-CM | POA: Insufficient documentation

## 2015-02-27 DIAGNOSIS — Z3A38 38 weeks gestation of pregnancy: Secondary | ICD-10-CM | POA: Diagnosis not present

## 2015-02-27 LAB — POCT FERN TEST: POCT FERN TEST: NEGATIVE

## 2015-02-27 NOTE — MAU Provider Note (Signed)
S: Judith Mckinney is a 19 y.o. G1P0 at 1154w0d who presents today with leaking of fluid. She denies any VB. She confirms fetal movement. O: VSS, afebrile Abdomen: soft, non-tender, gravid External: no lesion Vagina:large amt of yellow mucus but no pooling noted.  Pt asked to cough and no liquid noted.   Cervix: pink, smooth, no CMT Uterus: AGA A/P: Exam for ROM RN will report to attending MD

## 2015-02-27 NOTE — Progress Notes (Signed)
May d/c home 

## 2015-02-27 NOTE — MAU Note (Signed)
Irregular contractions all day. Constant lower abdominal pressure and low back pain. Denies vaginal bleeding. Leaking clear watery fluid all day. Positive fetal movement. Denies complications with pregnancy.

## 2015-03-07 ENCOUNTER — Encounter (HOSPITAL_COMMUNITY): Payer: Self-pay | Admitting: *Deleted

## 2015-03-07 ENCOUNTER — Inpatient Hospital Stay (HOSPITAL_COMMUNITY)
Admission: AD | Admit: 2015-03-07 | Discharge: 2015-03-10 | DRG: 774 | Disposition: A | Discharge status: Home / Self Care | Payer: Medicaid Other | Source: Ambulatory Visit | Attending: Obstetrics & Gynecology | Admitting: Obstetrics & Gynecology

## 2015-03-07 DIAGNOSIS — O1490 Unspecified pre-eclampsia, unspecified trimester: Secondary | ICD-10-CM | POA: Diagnosis present

## 2015-03-07 DIAGNOSIS — O133 Gestational [pregnancy-induced] hypertension without significant proteinuria, third trimester: Secondary | ICD-10-CM | POA: Diagnosis present

## 2015-03-07 DIAGNOSIS — O1493 Unspecified pre-eclampsia, third trimester: Secondary | ICD-10-CM | POA: Diagnosis present

## 2015-03-07 DIAGNOSIS — Z87891 Personal history of nicotine dependence: Secondary | ICD-10-CM | POA: Diagnosis not present

## 2015-03-07 DIAGNOSIS — Z3A39 39 weeks gestation of pregnancy: Secondary | ICD-10-CM | POA: Diagnosis present

## 2015-03-07 DIAGNOSIS — Z8759 Personal history of other complications of pregnancy, childbirth and the puerperium: Secondary | ICD-10-CM

## 2015-03-07 HISTORY — DX: Other specified health status: Z78.9

## 2015-03-07 LAB — CBC WITH DIFFERENTIAL/PLATELET
BASOS ABS: 0 10*3/uL (ref 0.0–0.1)
Basophils Relative: 0 % (ref 0–1)
EOS ABS: 0.1 10*3/uL (ref 0.0–0.7)
EOS PCT: 1 % (ref 0–5)
HCT: 31.3 % — ABNORMAL LOW (ref 36.0–46.0)
Hemoglobin: 10.1 g/dL — ABNORMAL LOW (ref 12.0–15.0)
Lymphocytes Relative: 22 % (ref 12–46)
Lymphs Abs: 1.8 10*3/uL (ref 0.7–4.0)
MCH: 25.4 pg — AB (ref 26.0–34.0)
MCHC: 32.3 g/dL (ref 30.0–36.0)
MCV: 78.8 fL (ref 78.0–100.0)
Monocytes Absolute: 0.6 10*3/uL (ref 0.1–1.0)
Monocytes Relative: 7 % (ref 3–12)
NEUTROS ABS: 5.9 10*3/uL (ref 1.7–7.7)
NEUTROS PCT: 70 % (ref 43–77)
PLATELETS: 199 10*3/uL (ref 150–400)
RBC: 3.97 MIL/uL (ref 3.87–5.11)
RDW: 14 % (ref 11.5–15.5)
WBC: 8.5 10*3/uL (ref 4.0–10.5)

## 2015-03-07 LAB — COMPREHENSIVE METABOLIC PANEL
ALT: 11 U/L — ABNORMAL LOW (ref 14–54)
AST: 24 U/L (ref 15–41)
Albumin: 2.6 g/dL — ABNORMAL LOW (ref 3.5–5.0)
Alkaline Phosphatase: 166 U/L — ABNORMAL HIGH (ref 38–126)
Anion gap: 6 (ref 5–15)
BUN: 7 mg/dL (ref 6–20)
CALCIUM: 8.6 mg/dL — AB (ref 8.9–10.3)
CO2: 22 mmol/L (ref 22–32)
Chloride: 106 mmol/L (ref 101–111)
Creatinine, Ser: 0.74 mg/dL (ref 0.44–1.00)
GFR calc Af Amer: >60 mL/min (ref >60)
GFR calc non Af Amer: >60 mL/min (ref >60)
Glucose, Bld: 92 mg/dL (ref 65–99)
POTASSIUM: 3.9 mmol/L (ref 3.5–5.1)
SODIUM: 134 mmol/L — AB (ref 135–145)
Total Bilirubin: 0.4 mg/dL (ref 0.3–1.2)
Total Protein: 6.6 g/dL (ref 6.5–8.1)

## 2015-03-07 LAB — URIC ACID: Uric Acid, Serum: 6.3 mg/dL (ref 2.3–6.6)

## 2015-03-07 LAB — TYPE AND SCREEN
ABO/RH(D): O POS
Antibody Screen: NEGATIVE

## 2015-03-07 LAB — PROTEIN / CREATININE RATIO, URINE
Creatinine, Urine: 118 mg/dL
Protein Creatinine Ratio: 0.64 mg/mg{Cre} — ABNORMAL HIGH (ref 0.00–0.15)
TOTAL PROTEIN, URINE: 75 mg/dL

## 2015-03-07 LAB — LACTATE DEHYDROGENASE: LDH: 158 U/L (ref 98–192)

## 2015-03-07 MED ORDER — BUTORPHANOL TARTRATE 1 MG/ML IJ SOLN
1.0000 mg | INTRAMUSCULAR | Status: DC | PRN
  Administered 2015-03-07 (×2): 1 mg via INTRAVENOUS
  Filled 2015-03-07 (×2): qty 1

## 2015-03-07 MED ORDER — MISOPROSTOL 25 MCG QUARTER TABLET
25.0000 ug | ORAL_TABLET | ORAL | Status: DC | PRN
  Administered 2015-03-07: 25 ug via VAGINAL
  Filled 2015-03-07: qty 0.25

## 2015-03-07 MED ORDER — FENTANYL 2.5 MCG/ML BUPIVACAINE 1/10 % EPIDURAL INFUSION (WH - ANES)
14.0000 mL/h | INTRAMUSCULAR | Status: DC | PRN
  Administered 2015-03-08 (×2): 14 mL/h via EPIDURAL
  Filled 2015-03-07 (×2): qty 125

## 2015-03-07 MED ORDER — TERBUTALINE SULFATE 1 MG/ML IJ SOLN: 0.2500 mg | Freq: Once | INTRAMUSCULAR | Status: AC | PRN

## 2015-03-07 MED ORDER — CITRIC ACID-SODIUM CITRATE 334-500 MG/5ML PO SOLN
30.0000 mL | ORAL | Status: DC | PRN
  Filled 2015-03-07: qty 15

## 2015-03-07 MED ORDER — OXYTOCIN BOLUS FROM INFUSION: 500.0000 mL | INTRAVENOUS | Status: DC

## 2015-03-07 MED ORDER — EPHEDRINE 5 MG/ML INJ: 10.0000 mg | INTRAVENOUS | Status: DC | PRN

## 2015-03-07 MED ORDER — OXYCODONE-ACETAMINOPHEN 5-325 MG PO TABS: 2.0000 | ORAL_TABLET | ORAL | Status: DC | PRN

## 2015-03-07 MED ORDER — LACTATED RINGERS IV SOLN
INTRAVENOUS | Status: DC
  Administered 2015-03-07 – 2015-03-08 (×2): via INTRAVENOUS

## 2015-03-07 MED ORDER — DIPHENHYDRAMINE HCL 50 MG/ML IJ SOLN: 12.5000 mg | INTRAMUSCULAR | Status: DC | PRN

## 2015-03-07 MED ORDER — LACTATED RINGERS IV SOLN
500.0000 mL | INTRAVENOUS | Status: DC | PRN
  Administered 2015-03-08 (×2): 500 mL via INTRAVENOUS

## 2015-03-07 MED ORDER — LIDOCAINE HCL (PF) 1 % IJ SOLN
30.0000 mL | INTRAMUSCULAR | Status: DC | PRN
  Administered 2015-03-08: 30 mL via SUBCUTANEOUS
  Filled 2015-03-07: qty 30

## 2015-03-07 MED ORDER — OXYCODONE-ACETAMINOPHEN 5-325 MG PO TABS: 1.0000 | ORAL_TABLET | ORAL | Status: DC | PRN

## 2015-03-07 MED ORDER — ACETAMINOPHEN 325 MG PO TABS: 650.0000 mg | ORAL_TABLET | ORAL | Status: DC | PRN

## 2015-03-07 MED ORDER — PHENYLEPHRINE 40 MCG/ML (10ML) SYRINGE FOR IV PUSH (FOR BLOOD PRESSURE SUPPORT)
80.0000 ug | PREFILLED_SYRINGE | INTRAVENOUS | Status: DC | PRN
  Filled 2015-03-07: qty 20

## 2015-03-07 MED ORDER — OXYTOCIN 40 UNITS IN LACTATED RINGERS INFUSION - SIMPLE MED: 62.5000 mL/h | INTRAVENOUS | Status: DC

## 2015-03-07 MED ORDER — OXYTOCIN 40 UNITS IN LACTATED RINGERS INFUSION - SIMPLE MED
1.0000 m[IU]/min | INTRAVENOUS | Status: DC
  Administered 2015-03-07: 2 m[IU]/min via INTRAVENOUS
  Filled 2015-03-07: qty 1000

## 2015-03-07 MED ORDER — ONDANSETRON HCL 4 MG/2ML IJ SOLN: 4.0000 mg | Freq: Four times a day (QID) | INTRAMUSCULAR | Status: DC | PRN

## 2015-03-07 NOTE — H&P (Signed)
Judith Mckinney is a 19 y.o. female presenting for induction of labor for gestational hypertension possibly preeclampsia. Patient with several day h/o HA, no blurry vision, no RUQ pain.  Her BPs in office and MAU were mild range.   Maternal Medical History:  Reason for admission: Induction of labor  Contractions: Onset was less than 1 hour ago.   Frequency: irregular.   Perceived severity is mild.    Fetal activity: Perceived fetal activity is normal.   Last perceived fetal movement was within the past 12 hours.    Prenatal complications: Pre-eclampsia.   Prenatal Complications - Diabetes: none.    OB History    Gravida Para Term Preterm AB TAB SAB Ectopic Multiple Living   1              Past Medical History  Diagnosis Date  . Psoriasis   . Medical history non-contributory    Past Surgical History  Procedure Laterality Date  . No past surgeries     Family History: family history is not on file. Social History:  reports that she has quit smoking. She does not have any smokeless tobacco history on file. She reports that she does not drink alcohol or use illicit drugs.   Prenatal Transfer Tool  Maternal Diabetes: No Genetic Screening: Normal Maternal Ultrasounds/Referrals: Normal Fetal Ultrasounds or other Referrals:  Other: Anatomy scan normal Maternal Substance Abuse:  No Significant Maternal Medications:  None Significant Maternal Lab Results:  Lab values include: Group B Strep negative Other Comments:  None  Review of Systems  Constitutional: Negative.  Negative for fever and chills.  Eyes: Negative for blurred vision and double vision.  Respiratory: Negative for cough and hemoptysis.   Cardiovascular: Negative for chest pain and palpitations.  Gastrointestinal: Negative.  Negative for heartburn.  Genitourinary: Negative for dysuria and urgency.  Musculoskeletal: Negative for myalgias and neck pain.  Skin: Negative for itching and rash.  Neurological:  Positive for headaches. Negative for dizziness and tingling.  Endo/Heme/Allergies: Does not bruise/bleed easily.  All other systems reviewed and are negative.   Dilation: 3 Effacement (%): 80 Station: -2 Exam by:: Dherr rn Blood pressure 139/86, pulse 74, temperature 97.5 F (36.4 C), temperature source Oral, resp. rate 18, height  (1.676 m), weight 93.441 kg (206 lb). Maternal Exam:  Uterine Assessment: Contraction strength is mild.  Contraction duration is 50 seconds. Contraction frequency is irregular.   Abdomen: Patient reports no abdominal tenderness. Fundal height is 39 cm.   Estimated fetal weight is 3100 grams.   Fetal presentation: vertex  Introitus: Normal vulva. Normal vagina.  Ferning test: not done.  Nitrazine test: not done. Amniotic fluid character: not assessed.  Pelvis: adequate for delivery.   Cervix: Cervix evaluated by digital exam.   3/80/-2  Fetal Exam Fetal Monitor Review: Baseline rate: 145.  Variability: moderate (6-25 bpm).   Pattern: no decelerations and accelerations present.    Fetal State Assessment: Category I - tracings are normal.     Physical Exam  Nursing note and vitals reviewed. Constitutional: She is oriented to person, place, and time. She appears well-developed and well-nourished.  HENT:  Head: Normocephalic and atraumatic.  Eyes: Pupils are equal, round, and reactive to light.  Neck: Normal range of motion.  Cardiovascular: Normal rate and regular rhythm.   Respiratory: Effort normal.  GI: Soft.  Musculoskeletal: Normal range of motion.  Neurological: She is alert and oriented to person, place, and time. She has normal reflexes.  Skin: Skin  is warm.    Prenatal labs: ABO, Rh: O/Positive/-- (01/21 0000) Antibody: Negative (01/21 0000) Rubella: Immune (01/21 0000) RPR: Nonreactive (01/21 0000)  HBsAg: Negative (01/21 0000)  HIV: Non-reactive (01/21 0000)  GBS: Negative (06/26 0000)   Assessment/Plan: 19 yo G1P0 at  39 weeks 1 day with preeclampsia Mild range BPs. Labs normal except for Pr: Cr ratio Misoprostol x 1 then pitocin Continuous monitoring Epidural on demand   Judith Mckinney Judith Mckinney 03/07/2015, 4:48 PM

## 2015-03-07 NOTE — MAU Provider Note (Signed)
History     CSN: [id number]  Arrival date and time: 03/07/15 1130   First Provider Initiated Contact with Patient 03/07/15 1231      Chief Complaint  Patient presents with  . Hypertension   HPI Judith Mckinney 19 y.o. G1P0 @[redacted]w[redacted]d  presents to MAU with elevated blood pressure in clinic this am.  She has HAs off and on over the last couple of days but none today.  Denies epigastric pain, vision changes, increased swelling.   Baby is moving well.  Denies LOF, vaginal bleeding, contractions.  OB History    Gravida Para Term Preterm AB TAB SAB Ectopic Multiple Living   1               Past Medical History  Diagnosis Date  . Psoriasis   . Medical history non-contributory     Past Surgical History  Procedure Laterality Date  . No past surgeries      History reviewed. No pertinent family history.  History  Substance Use Topics  . Smoking status: Former Games developermoker  . Smokeless tobacco: Not on file  . Alcohol Use: No    Allergies:  Allergies  Allergen Reactions  . Neosporin [Neomycin-Bacitracin Zn-Polymyx] Rash    Prescriptions prior to admission  Medication Sig Dispense Refill Last Dose  . calcium carbonate (TUMS - DOSED IN MG ELEMENTAL CALCIUM) 500 MG chewable tablet Chew 1-2 tablets by mouth as needed for heartburn. Patient takes up to 5 times daily prn.   03/06/2015 at Unknown time  . diphenhydrAMINE (BENADRYL) 2 % cream Apply 1 application topically 3 (three) times daily as needed for itching.   03/06/2015 at Unknown time  . Iron TABS Take 1 tablet by mouth daily.   03/06/2015 at Unknown time  . Prenatal Vit-Fe Fumarate-FA (PRENATAL MULTIVITAMIN) TABS tablet Take 1 tablet by mouth daily at 12 noon.   03/07/2015 at Unknown time  . cyclobenzaprine (FLEXERIL) 5 MG tablet Take 1-2 tablets (5-10 mg total) by mouth 3 (three) times daily as needed for muscle spasms. (Patient not taking: Reported on 02/27/2015) 30 tablet 0 Not Taking at Unknown time  . promethazine (PHENERGAN) 12.5  MG tablet Take 1 tablet (12.5 mg total) by mouth every 6 (six) hours as needed for nausea or vomiting. 30 tablet 0 prn    ROS Pertinent ROS in HPI.  All other systems are negative.   Physical Exam   Blood pressure 141/85, pulse 73, temperature 97.5 F (36.4 C), temperature source Oral, resp. rate 18.  Physical Exam  Constitutional: She is oriented to person, place, and time. She appears well-developed and well-nourished. No distress.  HENT:  Head: Normocephalic and atraumatic.  Eyes: EOM are normal.  Neck: Normal range of motion.  Cardiovascular: Normal rate.   Respiratory: Effort normal and breath sounds normal. No respiratory distress.  GI: Soft.  Musculoskeletal: Normal range of motion.  Neurological: She is alert and oriented to person, place, and time.  Skin: Skin is warm and dry.  Psychiatric: She has a normal mood and affect.   Results for orders placed or performed during the hospital encounter of 03/07/15 (from the past 24 hour(s))  Protein / creatinine ratio, urine     Status: Abnormal   Collection Time: 03/07/15 11:40 AM  Result Value Ref Range   Creatinine, Urine 118.00 mg/dL   Total Protein, Urine 75 mg/dL   Protein Creatinine Ratio 0.64 (H) 0.00 - 0.15 mg/mg[Cre]  CBC with Differential/Platelet     Status: Abnormal  Collection Time: 03/07/15 11:45 AM  Result Value Ref Range   WBC 8.5 4.0 - 10.5 K/uL   RBC 3.97 3.87 - 5.11 MIL/uL   Hemoglobin 10.1 (L) 12.0 - 15.0 g/dL   HCT 16.1 (L) 09.6 - 04.5 %   MCV 78.8 78.0 - 100.0 fL   MCH 25.4 (L) 26.0 - 34.0 pg   MCHC 32.3 30.0 - 36.0 g/dL   RDW 40.9 81.1 - 91.4 %   Platelets 199 150 - 400 K/uL   Neutrophils Relative % 70 43 - 77 %   Neutro Abs 5.9 1.7 - 7.7 K/uL   Lymphocytes Relative 22 12 - 46 %   Lymphs Abs 1.8 0.7 - 4.0 K/uL   Monocytes Relative 7 3 - 12 %   Monocytes Absolute 0.6 0.1 - 1.0 K/uL   Eosinophils Relative 1 0 - 5 %   Eosinophils Absolute 0.1 0.0 - 0.7 K/uL   Basophils Relative 0 0 - 1 %    Basophils Absolute 0.0 0.0 - 0.1 K/uL  Comprehensive metabolic panel     Status: Abnormal   Collection Time: 03/07/15 11:45 AM  Result Value Ref Range   Sodium 134 (L) 135 - 145 mmol/L   Potassium 3.9 3.5 - 5.1 mmol/L   Chloride 106 101 - 111 mmol/L   CO2 22 22 - 32 mmol/L   Glucose, Bld 92 65 - 99 mg/dL   BUN 7 6 - 20 mg/dL   Creatinine, Ser 7.82 0.44 - 1.00 mg/dL   Calcium 8.6 (L) 8.9 - 10.3 mg/dL   Total Protein 6.6 6.5 - 8.1 g/dL   Albumin 2.6 (L) 3.5 - 5.0 g/dL   AST 24 15 - 41 U/L   ALT 11 (L) 14 - 54 U/L   Alkaline Phosphatase 166 (H) 38 - 126 U/L   Total Bilirubin 0.4 0.3 - 1.2 mg/dL   GFR calc non Af Amer >60 >60 mL/min   GFR calc Af Amer >60 >60 mL/min   Anion gap 6 5 - 15  Uric acid     Status: None   Collection Time: 03/07/15 11:45 AM  Result Value Ref Range   Uric Acid, Serum 6.3 2.3 - 6.6 mg/dL  Lactate dehydrogenase     Status: None   Collection Time: 03/07/15 11:45 AM  Result Value Ref Range   LDH 158 98 - 192 U/L   Fetal Tracing: Baseline:120s Variability:marked Accelerations: 15x15s Decelerations:none Toco:irritability    MAU Course  Procedures  MDM Discussed with Dr. Mora Appl.  Pt will be admitted for induction of labor.  Care turned over to Dr. Mora Appl.  Assessment and Plan  A: PIH  P: Admit  Bertram Denver 03/07/2015, 12:33 PM

## 2015-03-07 NOTE — MAU Note (Signed)
Pt sent from office, BP elevated again- sent over for further eval.  Pt has had headaches- past few days, none currently. Denies visual changes, epigastric pain or increase in swelling.  Pt unable to void at this time

## 2015-03-07 NOTE — Progress Notes (Signed)
Judith Mckinney is a 19 y.o. G1P0 at 2484w1d by LMP admitted for induction of labor due to Pre-eclamptic toxemia of pregnancy..  Subjective: Patient feels more contraction pain  Objective: BP 144/49 mmHg  Pulse 64  Temp(Src) 97.8 F (36.6 C) (Axillary)  Resp 18  Ht 5\' 6"  (1.676 m)  Wt 93.441 kg (206 lb)  BMI 33.27 kg/m2      FHT:  FHR: 145 bpm, variability: moderate,  accelerations:  Present,  decelerations:  Absent UC:   irregular, every 3-5 minutes SVE:   Dilation: 4 Effacement (%): 60 Station: -2 Exam by:: Tykeem Lanzer MD  Labs: Lab Results  Component Value Date   WBC 8.5 03/07/2015   HGB 10.1* 03/07/2015   HCT 31.3* 03/07/2015   MCV 78.8 03/07/2015   PLT 199 03/07/2015    Assessment / Plan: Induction of labor due to preeclampsia,  progressing well s/p misoprostol x 1  Labor: Progressing normally and will start Pitocin Preeclampsia:  no signs or symptoms of toxicity and intake and ouput balanced Fetal Wellbeing:  Category I Pain Control:  Labor support IV pain meds I/D:  n/a Anticipated MOD:  NSVD  Casey Fye STACIA 03/07/2015, 8:33 PM

## 2015-03-08 ENCOUNTER — Inpatient Hospital Stay (HOSPITAL_COMMUNITY): Payer: Medicaid Other | Admitting: Anesthesiology

## 2015-03-08 ENCOUNTER — Encounter (HOSPITAL_COMMUNITY): Payer: Self-pay | Admitting: *Deleted

## 2015-03-08 DIAGNOSIS — Z8759 Personal history of other complications of pregnancy, childbirth and the puerperium: Secondary | ICD-10-CM

## 2015-03-08 LAB — LACTATE DEHYDROGENASE: LDH: 162 U/L (ref 98–192)

## 2015-03-08 LAB — COMPREHENSIVE METABOLIC PANEL
ALBUMIN: 2.3 g/dL — AB (ref 3.5–5.0)
ALT: 11 U/L — ABNORMAL LOW (ref 14–54)
ANION GAP: 7 (ref 5–15)
AST: 31 U/L (ref 15–41)
Alkaline Phosphatase: 147 U/L — ABNORMAL HIGH (ref 38–126)
BUN: 9 mg/dL (ref 6–20)
CO2: 20 mmol/L — ABNORMAL LOW (ref 22–32)
CREATININE: 0.8 mg/dL (ref 0.44–1.00)
Calcium: 8.1 mg/dL — ABNORMAL LOW (ref 8.9–10.3)
Chloride: 106 mmol/L (ref 101–111)
GFR calc Af Amer: >60 mL/min (ref >60)
GFR calc non Af Amer: >60 mL/min (ref >60)
Glucose, Bld: 87 mg/dL (ref 65–99)
POTASSIUM: 3.9 mmol/L (ref 3.5–5.1)
Sodium: 133 mmol/L — ABNORMAL LOW (ref 135–145)
Total Bilirubin: 0.5 mg/dL (ref 0.3–1.2)
Total Protein: 5.7 g/dL — ABNORMAL LOW (ref 6.5–8.1)

## 2015-03-08 LAB — CBC
HCT: 30.9 % — ABNORMAL LOW (ref 36.0–46.0)
HEMATOCRIT: 33.7 % — AB (ref 36.0–46.0)
HEMOGLOBIN: 9.9 g/dL — AB (ref 12.0–15.0)
Hemoglobin: 10.7 g/dL — ABNORMAL LOW (ref 12.0–15.0)
MCH: 24.9 pg — ABNORMAL LOW (ref 26.0–34.0)
MCH: 25.1 pg — AB (ref 26.0–34.0)
MCHC: 31.8 g/dL (ref 30.0–36.0)
MCHC: 32 g/dL (ref 30.0–36.0)
MCV: 78.4 fL (ref 78.0–100.0)
MCV: 78.2 fL (ref 78.0–100.0)
Platelets: 214 10*3/uL (ref 150–400)
Platelets: 186 10*3/uL (ref 150–400)
RBC: 4.3 MIL/uL (ref 3.87–5.11)
RBC: 3.95 MIL/uL (ref 3.87–5.11)
RDW: 14.2 % (ref 11.5–15.5)
RDW: 14.2 % (ref 11.5–15.5)
WBC: 11.9 10*3/uL — AB (ref 4.0–10.5)
WBC: 12.8 10*3/uL — AB (ref 4.0–10.5)

## 2015-03-08 LAB — URIC ACID: URIC ACID, SERUM: 6.4 mg/dL (ref 2.3–6.6)

## 2015-03-08 LAB — RPR: RPR Ser Ql: NONREACTIVE

## 2015-03-08 LAB — HIV ANTIBODY (ROUTINE TESTING W REFLEX): HIV Screen 4th Generation wRfx: NONREACTIVE

## 2015-03-08 LAB — ABO/RH: ABO/RH(D): O POS

## 2015-03-08 MED ORDER — ACETAMINOPHEN 325 MG PO TABS: 650.0000 mg | ORAL_TABLET | ORAL | Status: DC | PRN

## 2015-03-08 MED ORDER — LANOLIN HYDROUS EX OINT: TOPICAL_OINTMENT | CUTANEOUS | Status: DC | PRN

## 2015-03-08 MED ORDER — OXYCODONE-ACETAMINOPHEN 5-325 MG PO TABS
2.0000 | ORAL_TABLET | ORAL | Status: DC | PRN
Start: 2015-03-08 — End: 2015-03-10

## 2015-03-08 MED ORDER — SENNOSIDES-DOCUSATE SODIUM 8.6-50 MG PO TABS
2.0000 | ORAL_TABLET | ORAL | Status: DC
  Administered 2015-03-09 (×2): 2 via ORAL
  Filled 2015-03-08 (×2): qty 2

## 2015-03-08 MED ORDER — LIDOCAINE HCL (PF) 1 % IJ SOLN
INTRAMUSCULAR | Status: DC | PRN
  Administered 2015-03-08: 6 mL
  Administered 2015-03-08: 4 mL

## 2015-03-08 MED ORDER — TETANUS-DIPHTH-ACELL PERTUSSIS 5-2.5-18.5 LF-MCG/0.5 IM SUSP: 0.5000 mL | Freq: Once | INTRAMUSCULAR | Status: DC

## 2015-03-08 MED ORDER — MAGNESIUM SULFATE 2 GM/50ML IV SOLN: 2.0000 g | Freq: Once | INTRAVENOUS | Status: DC

## 2015-03-08 MED ORDER — WITCH HAZEL-GLYCERIN EX PADS: 1.0000 "application " | MEDICATED_PAD | CUTANEOUS | Status: DC | PRN

## 2015-03-08 MED ORDER — DIPHENHYDRAMINE HCL 25 MG PO CAPS: 25.0000 mg | ORAL_CAPSULE | Freq: Four times a day (QID) | ORAL | Status: DC | PRN

## 2015-03-08 MED ORDER — OXYTOCIN 40 UNITS IN LACTATED RINGERS INFUSION - SIMPLE MED: 62.5000 mL/h | INTRAVENOUS | Status: DC | PRN

## 2015-03-08 MED ORDER — MAGNESIUM SULFATE 50 % IJ SOLN
2.0000 g/h | INTRAVENOUS | Status: DC
  Filled 2015-03-08: qty 80

## 2015-03-08 MED ORDER — PRENATAL MULTIVITAMIN CH
1.0000 | ORAL_TABLET | Freq: Every day | ORAL | Status: DC
  Administered 2015-03-08 – 2015-03-09 (×2): 1 via ORAL
  Filled 2015-03-08 (×2): qty 1

## 2015-03-08 MED ORDER — DIBUCAINE 1 % RE OINT: 1.0000 "application " | TOPICAL_OINTMENT | RECTAL | Status: DC | PRN

## 2015-03-08 MED ORDER — ONDANSETRON HCL 4 MG/2ML IJ SOLN: 4.0000 mg | INTRAMUSCULAR | Status: DC | PRN

## 2015-03-08 MED ORDER — ZOLPIDEM TARTRATE 5 MG PO TABS: 5.0000 mg | ORAL_TABLET | Freq: Every evening | ORAL | Status: DC | PRN

## 2015-03-08 MED ORDER — ONDANSETRON HCL 4 MG PO TABS: 4.0000 mg | ORAL_TABLET | ORAL | Status: DC | PRN

## 2015-03-08 MED ORDER — BENZOCAINE-MENTHOL 20-0.5 % EX AERO
1.0000 "application " | INHALATION_SPRAY | CUTANEOUS | Status: DC | PRN
  Administered 2015-03-08: 1 via TOPICAL
  Filled 2015-03-08: qty 56

## 2015-03-08 MED ORDER — FENTANYL 2.5 MCG/ML BUPIVACAINE 1/10 % EPIDURAL INFUSION (WH - ANES)
INTRAMUSCULAR | Status: DC | PRN
  Administered 2015-03-08: 14 mL/h via EPIDURAL

## 2015-03-08 MED ORDER — OXYCODONE-ACETAMINOPHEN 5-325 MG PO TABS
1.0000 | ORAL_TABLET | ORAL | Status: DC | PRN
  Administered 2015-03-09: 1 via ORAL
  Filled 2015-03-08: qty 1

## 2015-03-08 MED ORDER — SIMETHICONE 80 MG PO CHEW: 80.0000 mg | CHEWABLE_TABLET | ORAL | Status: DC | PRN

## 2015-03-08 MED ORDER — FENTANYL 2.5 MCG/ML BUPIVACAINE 1/10 % EPIDURAL INFUSION (WH - ANES)
14.0000 mL/h | INTRAMUSCULAR | Status: DC | PRN
Start: 2015-03-08 — End: 2015-03-08

## 2015-03-08 MED ORDER — IBUPROFEN 600 MG PO TABS
600.0000 mg | ORAL_TABLET | Freq: Four times a day (QID) | ORAL | Status: DC
  Administered 2015-03-08 – 2015-03-10 (×8): 600 mg via ORAL
  Filled 2015-03-08 (×8): qty 1

## 2015-03-08 MED ORDER — MAGNESIUM SULFATE BOLUS VIA INFUSION
4.0000 g | Freq: Once | INTRAVENOUS | Status: DC
  Filled 2015-03-08: qty 500

## 2015-03-08 NOTE — Anesthesia Postprocedure Evaluation (Signed)
  Anesthesia Post-op Note  Patient: Judith Mckinney  Procedure(s) Performed: * No procedures listed *  Patient Location: 119  Anesthesia Type:Epidural  Level of Consciousness: awake  Airway and Oxygen Therapy: Patient Spontanous Breathing  Post-op Pain: mild  Post-op Assessment: Patient's Cardiovascular Status Stable and Respiratory Function Stable              Post-op Vital Signs: stable  Last Vitals:  Filed Vitals:   03/08/15 1130  BP: 119/79  Pulse: 76  Temp:   Resp: 20    Complications: No apparent anesthesia complications

## 2015-03-08 NOTE — Anesthesia Procedure Notes (Signed)
Epidural Patient location during procedure: OB Start time: 03/08/2015 1:42 AM End time: 03/08/2015 1:56 AM  Staffing Anesthesiologist: Sebastian AcheMANNY, Nezzie Manera  Preanesthetic Checklist Completed: patient identified, site marked, surgical consent, pre-op evaluation, timeout performed, IV checked, risks and benefits discussed and monitors and equipment checked  Epidural Patient position: sitting Prep: site prepped and draped and DuraPrep Patient monitoring: heart rate, continuous pulse ox and blood pressure Approach: midline Location: L3-L4 Injection technique: LOR air  Needle:  Needle type: Tuohy  Needle gauge: 17 G Needle length: 9 cm and 9 Needle insertion depth: 6 cm Catheter type: closed end flexible Catheter size: 19 Gauge Catheter at skin depth: 14 cm Test dose: negative  Assessment Events: blood not aspirated, injection not painful, no injection resistance, negative IV test and no paresthesia  Additional Notes   Patient tolerated the insertion well without complications.Reason for block:procedure for pain

## 2015-03-08 NOTE — Progress Notes (Signed)
Patient complaining of feeling light-headed, seeing spots, chest pain, and sudden onset R epigastric pain.  Patient placed in high-fowlers & MD called to come evaluate.

## 2015-03-08 NOTE — Lactation Note (Signed)
This note was copied from the chart of Judith Mckinney. Lactation Consultation Note  Patient Name: Judith Mckinney Today's Date: 03/08/2015 Reason for consult: Initial assessment Baby 9 hours old. Patient's RN [name] [facility] aware that mom has large areolas and short-shafted nipples, and that she gave patient a hand pump and assisted with latching baby. When this LC visited mom's room, the room was full of visitors. Visitors were passing baby around and mom stated that she thought things were going well with BF. Enc mom to ask for assistance as needed. [facility] given [facility], aware of OP/BFSG, community resources, and [facility] after [facility]/C.   Maternal Data    Feeding Feeding Type: Breast Fed Length of feed: 10 min  LATCH Score/Interventions Latch: Repeated attempts needed to sustain latch, nipple held in mouth throughout feeding, stimulation needed to elicit sucking reflex. Intervention(s): Adjust position;Assist with latch;Breast compression  Audible Swallowing: A few with stimulation  Type of Nipple: Everted at rest and after stimulation (short shaft)  Comfort (Breast/Nipple): Soft / non-tender     Hold (Positioning): Assistance needed to correctly position infant at breast and maintain latch. Intervention(s): Breastfeeding basics reviewed;Support Pillows;Position options;Skin to skin  LATCH Score: 7  Lactation Tools Discussed/Used     Consult Status Consult Status: Follow-up Date: 03/09/15 Follow-up type: In-patient    Geralynn OchsWILLIARD, Milee Qualls 03/08/2015, 5:45 PM

## 2015-03-08 NOTE — Anesthesia Preprocedure Evaluation (Signed)
Anesthesia Evaluation  Patient identified by MRN, date of birth, ID band Patient awake and Patient confused    Reviewed: Allergy & Precautions, H&P , NPO status , Patient's Chart, lab work & pertinent test results, reviewed documented beta blocker date and time   Airway Mallampati: II  TM Distance: >3 FB Neck ROM: full    Dental no notable dental hx.    Pulmonary neg pulmonary ROS, former smoker,  breath sounds clear to auscultation  Pulmonary exam normal       Cardiovascular Exercise Tolerance: Good hypertension, negative cardio ROS Normal cardiovascular examRhythm:regular Rate:Normal     Neuro/Psych negative neurological ROS  negative psych ROS   GI/Hepatic negative GI ROS, Neg liver ROS,   Endo/Other  negative endocrine ROS  Renal/GU negative Renal ROS  negative genitourinary   Musculoskeletal   Abdominal   Peds  Hematology negative hematology ROS (+)   Anesthesia Other Findings   Reproductive/Obstetrics negative OB ROS (+) Pregnancy PIH without complicating symptoms                               Anesthesia Physical Anesthesia Plan  ASA: II  Anesthesia Plan: Epidural   Post-op Pain Management:    Induction:   Airway Management Planned:   Additional Equipment:   Intra-op Plan:   Post-operative Plan:   Informed Consent: I have reviewed the patients History and Physical, chart, labs and discussed the procedure including the risks, benefits and alternatives for the proposed anesthesia with the patient or authorized representative who has indicated his/her understanding and acceptance.   Dental Advisory Given  Plan Discussed with: CRNA  Anesthesia Plan Comments:         Anesthesia Quick Evaluation

## 2015-03-09 LAB — CBC
HCT: 26.7 % — ABNORMAL LOW (ref 36.0–46.0)
Hemoglobin: 8.4 g/dL — ABNORMAL LOW (ref 12.0–15.0)
MCH: 24.8 pg — ABNORMAL LOW (ref 26.0–34.0)
MCHC: 31.5 g/dL (ref 30.0–36.0)
MCV: 78.8 fL (ref 78.0–100.0)
Platelets: 168 10*3/uL (ref 150–400)
RBC: 3.39 MIL/uL — ABNORMAL LOW (ref 3.87–5.11)
RDW: 14.6 % (ref 11.5–15.5)
WBC: 12.5 10*3/uL — ABNORMAL HIGH (ref 4.0–10.5)

## 2015-03-09 NOTE — Lactation Note (Signed)
This note was copied from the chart of Girl Judith Mckinney. Lactation Consultation Note  Patient Name: Girl Baxter HireCristin Mckinney WGNFA'OToday's Date: 03/09/2015 Reason for consult: Follow-up assessment Baby 37 hours old. Mom states that BF going very well now. Mom states that her milk is starting to flow more now and baby is more satisfied at breast. Mom had questions about using a pump and introducing a bottle of EBM, and this was discussed. Mom states that she has a DEBP at home. Mom aware of OP/BFSG and LC phone line assistance after D/C.  Maternal Data    Feeding Feeding Type: Breast Fed Length of feed: 35 min  LATCH Score/Interventions Latch: Grasps breast easily, tongue down, lips flanged, rhythmical sucking. Intervention(s): Assist with latch;Breast compression  Audible Swallowing: Spontaneous and intermittent  Type of Nipple: Everted at rest and after stimulation Intervention(s): Reverse pressure  Comfort (Breast/Nipple): Soft / non-tender     Hold (Positioning): Assistance needed to correctly position infant at breast and maintain latch. Intervention(s): Support Pillows  LATCH Score: 9  Lactation Tools Discussed/Used     Consult Status Consult Status: Follow-up Date: 03/10/15 Follow-up type: In-patient    Geralynn OchsWILLIARD, Wilkins Elpers 03/09/2015, 8:53 PM

## 2015-03-09 NOTE — Progress Notes (Signed)
Post Partum Day 1 Subjective: no complaints, up ad lib, voiding, tolerating PO, + flatus and breast feeding  Objective: Blood pressure 138/81, pulse 83, temperature 97.9 F (36.6 C), temperature source Oral, resp. rate 20, height 5\' 6"  (1.676 m), weight 93.441 kg (206 lb), SpO2 100 %, unknown if currently breastfeeding.  Physical Exam:  General: alert, cooperative and no distress Lochia: appropriate Uterine Fundus: firm perineum: healing well, no significant drainage, no dehiscence, no significant erythema DVT Evaluation: No evidence of DVT seen on physical exam. Negative Homan's sign. No cords or calf tenderness.   Recent Labs  03/08/15 0822 03/09/15 0517  HGB 9.9* 8.4*  HCT 30.9* 26.7*    Assessment/Plan: Plan for discharge tomorrow and Breastfeeding   LOS: 2 days   Judith Mckinney STACIA 03/09/2015, 9:24 AM

## 2015-03-10 NOTE — Progress Notes (Signed)
UR chart review completed.  

## 2015-03-10 NOTE — Discharge Summary (Signed)
Obstetric Discharge Summary Reason for Admission: induction of labor Prenatal Procedures: Preeclampsia and ultrasound Intrapartum Procedures: vacuum Postpartum Procedures: none Complications-Operative and Postpartum: episotomy HEMOGLOBIN  Date Value Ref Range Status  03/09/2015 8.4* 12.0 - 15.0 g/dL Final   HGB  Date Value Ref Range Status  08/23/2014 12.8 12.0-16.0 g/dL Final   HCT  Date Value Ref Range Status  03/09/2015 26.7* 36.0 - 46.0 % Final  08/23/2014 37.8 35.0-47.0 % Final    Physical Exam:  General: alert Lochia: appropriate Uterine Fundus: firm   Discharge Diagnoses: Term Pregnancy-delivered and Preelampsia  Discharge Information: Date: 03/10/2015 Activity: pelvic rest Diet: routine Medications: PNV, Ibuprofen and Colace Condition: stable Instructions: refer to practice specific booklet Discharge to: home Follow-up Information    [facility] Sanjuana MaeWALDA STACIA, MD. Schedule an appointment as soon as possible for a visit in 1 month.   Specialty:  Obstetrics and Gynecology   Contact information:   [address] [place] [id number] [phone]       Newborn Data: Live born female  Birth Weight: 7 lb 9.5 oz (3445 g) APGAR: 8, 9  Home with mother.  Judith Mckinney E 03/10/2015, 8:41 AM

## 2015-03-10 NOTE — Progress Notes (Signed)
PPD#1 Pt without complaints. Would like to go home. She is stable. Will discharge.  VSSAF IMP/ S/P vag del doing well. Plan/ Will discharge.

## 2016-06-05 IMAGING — US US OB < 14 WEEKS
1 series · 14 of 28 positions shown · non-contrast
Comparison: None.

CLINICAL DATA: Pelvic pain.

EXAM:
OBSTETRIC <14 WK ULTRASOUND
TECHNIQUE: Transabdominal ultrasound was performed for evaluation of the
gestation as well as the maternal uterus and adnexal regions.

[Series 1: us ob < 14 weeks · 0.22mm/px · 14 of 45 slices shown]
[im 2/45]
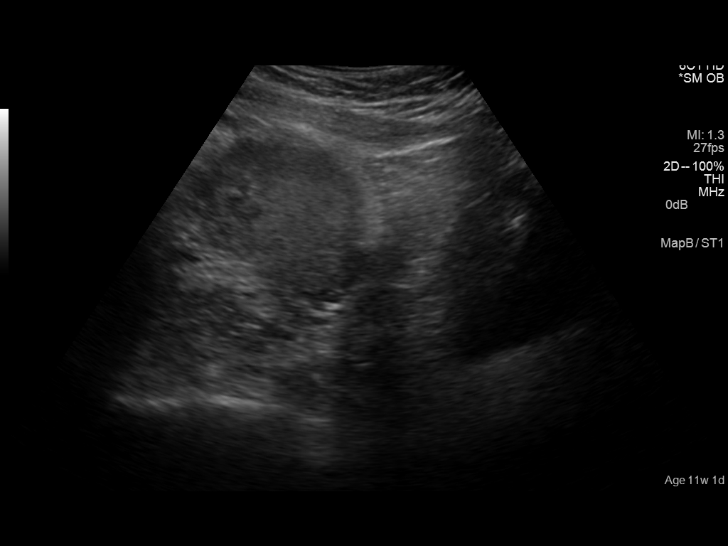
[im 5/45]
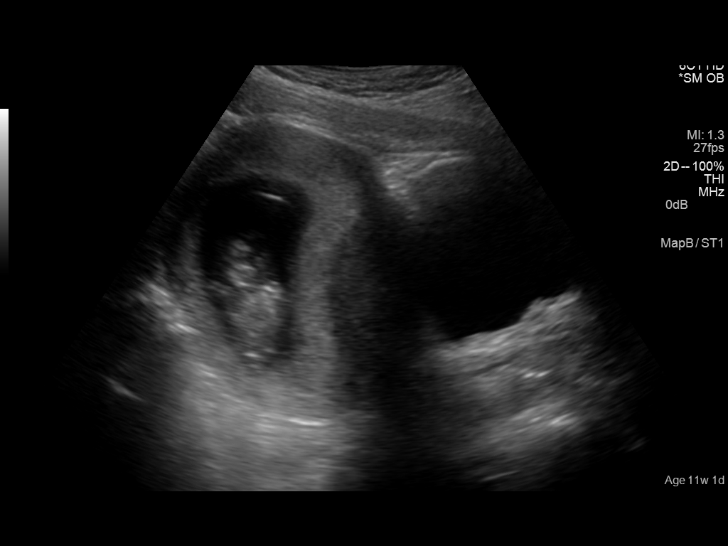
[im 9/45]
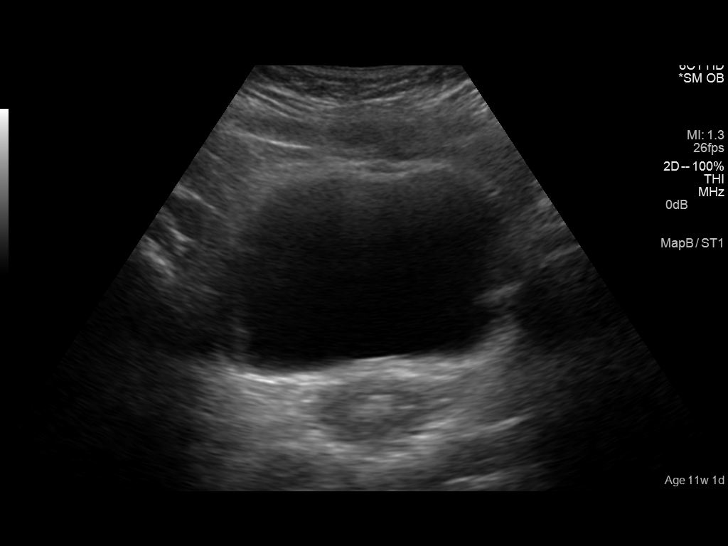
[im 12/45]
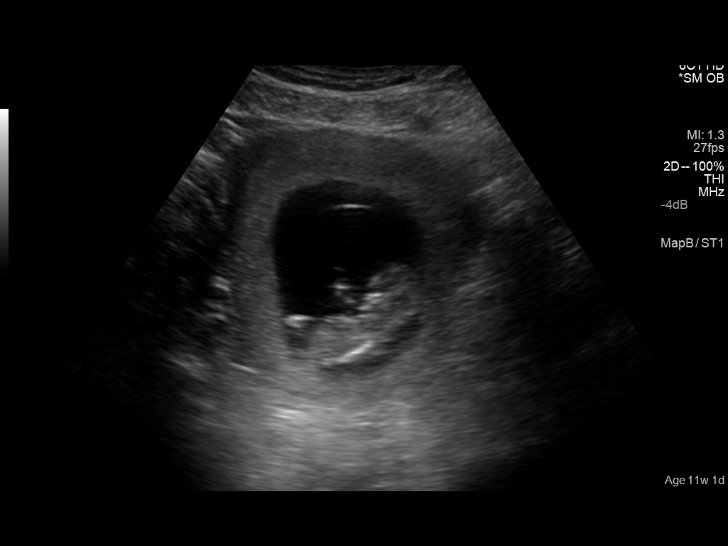
[im 15/45]
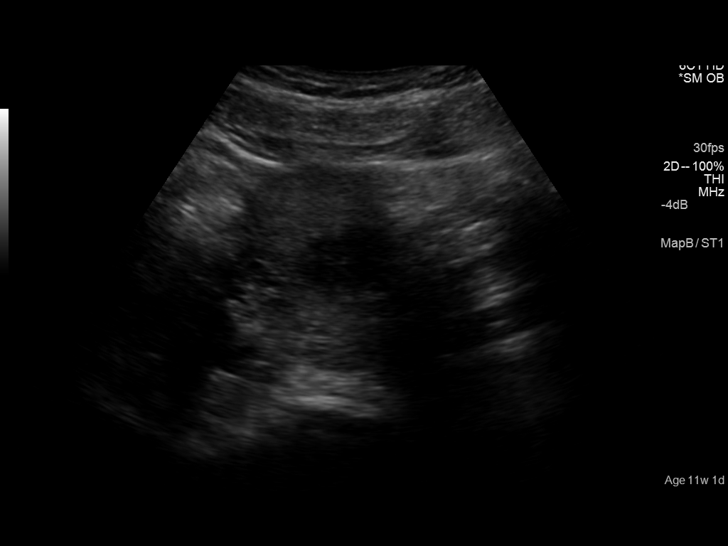
[im 18/45]
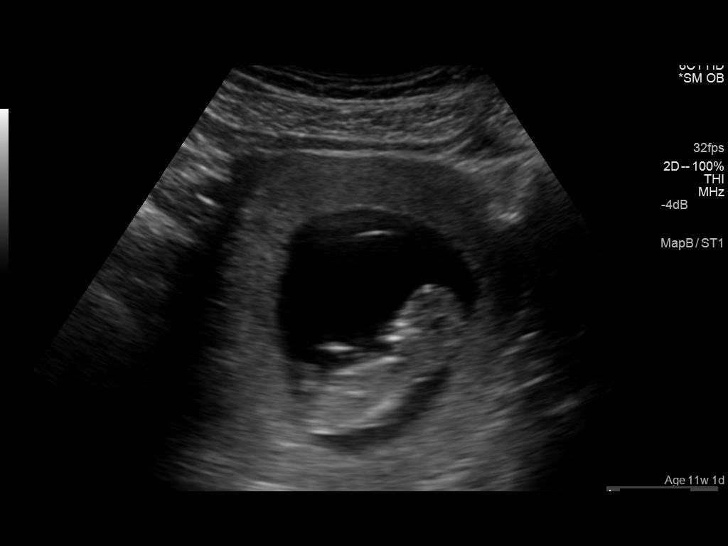
[im 22/45]
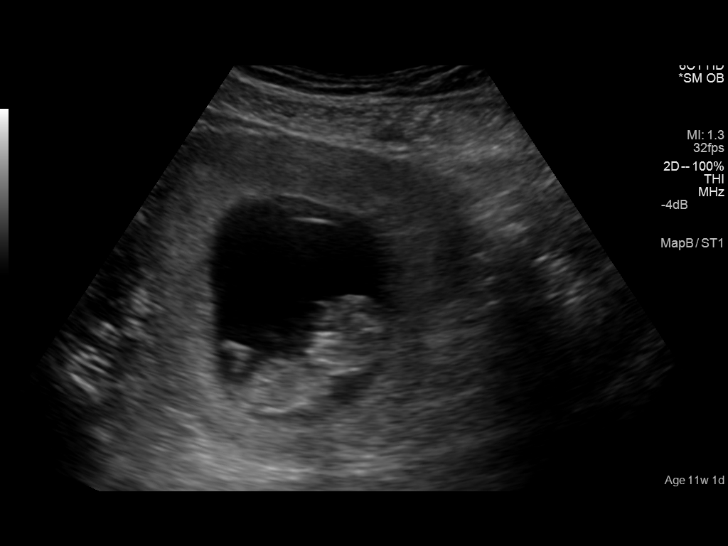
[im 25/45]
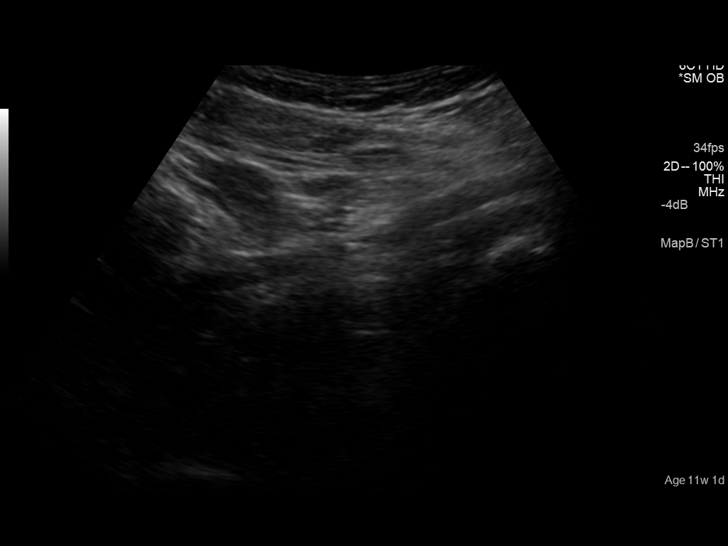
[im 28/45]
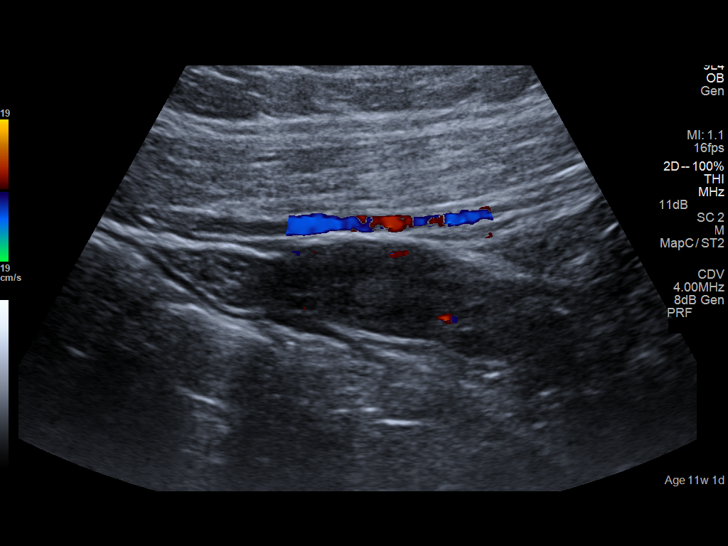
[im 31/45]
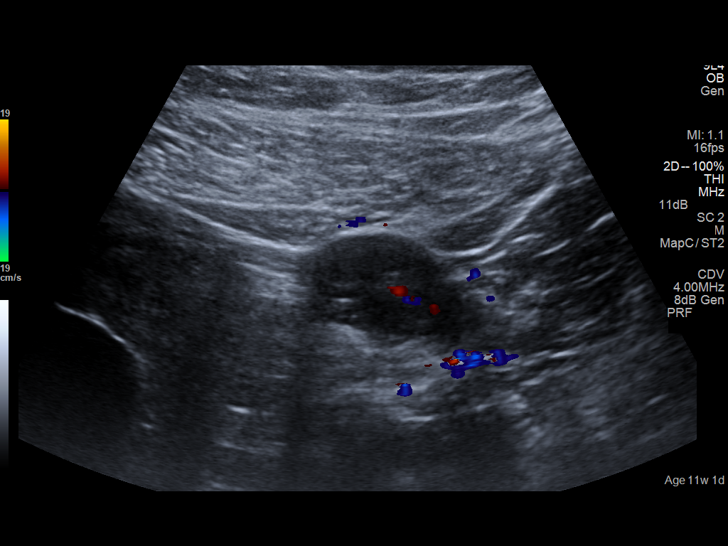
[im 35/45]
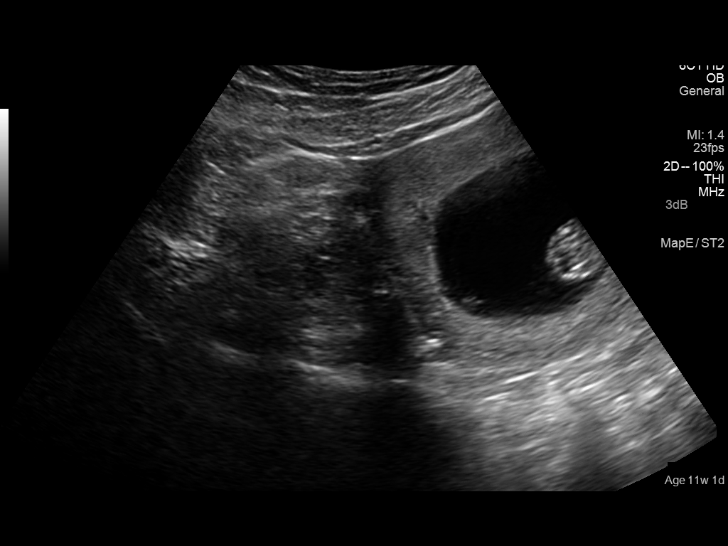
[im 38/45]
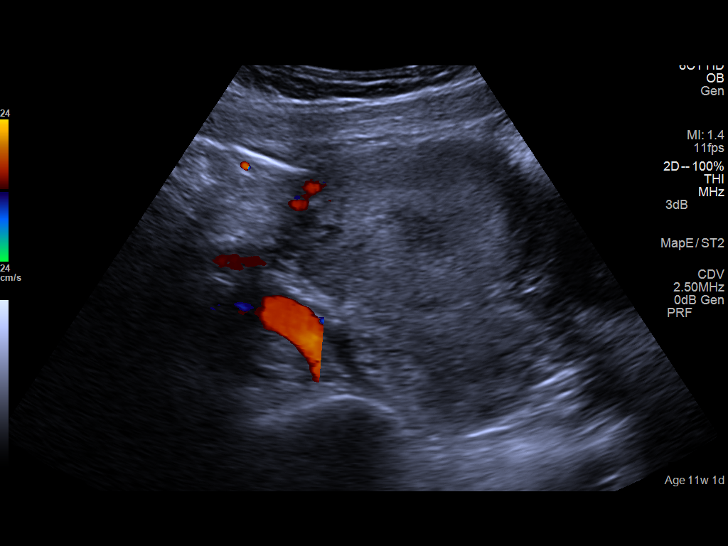
[im 41/45]
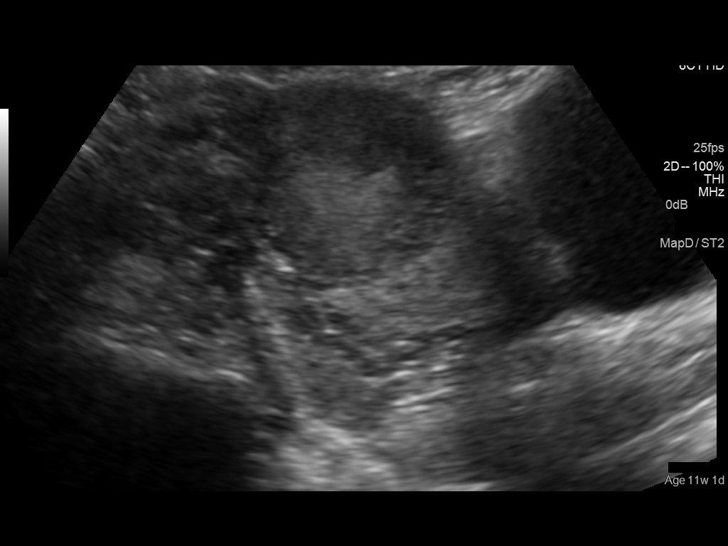
[im 45/45]
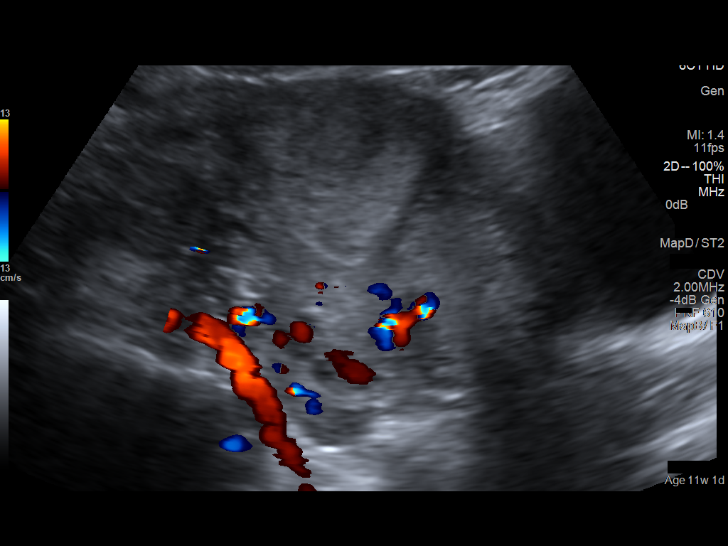

[14 of 28 positions shown; findings below may reference images not displayed]

FINDINGS: Intrauterine gestational sac: Visualized/normal in shape.

Yolk sac:  Visualized.

Embryo:  Visualized.

Cardiac Activity: Visualized.

Heart Rate: 173 bpm

CRL:   51  mm   11 w 6 d

Maternal uterus/adnexae: No hemorrhage is noted. Ovaries appear
normal. No free fluid is noted.
IMPRESSION: Single live intrauterine gestation of 11 weeks 6 days.

## 2019-05-01 ENCOUNTER — Other Ambulatory Visit: Payer: Self-pay

## 2019-05-01 ENCOUNTER — Emergency Department: Payer: BC Managed Care – PPO

## 2019-05-01 ENCOUNTER — Encounter: Payer: Self-pay | Admitting: Emergency Medicine

## 2019-05-01 ENCOUNTER — Emergency Department
Admission: EM | Admit: 2019-05-01 | Discharge: 2019-05-01 | Disposition: A | Discharge status: Home / Self Care | Payer: BC Managed Care – PPO | Attending: Student in an Organized Health Care Education/Training Program | Admitting: Student in an Organized Health Care Education/Training Program

## 2019-05-01 DIAGNOSIS — R1031 Right lower quadrant pain: Secondary | ICD-10-CM | POA: Insufficient documentation

## 2019-05-01 DIAGNOSIS — Z87891 Personal history of nicotine dependence: Secondary | ICD-10-CM | POA: Insufficient documentation

## 2019-05-01 LAB — CBC WITH DIFFERENTIAL/PLATELET
Abs Immature Granulocytes: 0.01 10*3/uL (ref 0.00–0.07)
Basophils Absolute: 0 10*3/uL (ref 0.0–0.1)
Basophils Relative: 0 %
Eosinophils Absolute: 0.1 10*3/uL (ref 0.0–0.5)
Eosinophils Relative: 2 %
HCT: 38.4 % (ref 36.0–46.0)
Hemoglobin: 13.1 g/dL (ref 12.0–15.0)
Immature Granulocytes: 0 %
Lymphocytes Relative: 36 %
Lymphs Abs: 1.8 10*3/uL (ref 0.7–4.0)
MCH: 30.4 pg (ref 26.0–34.0)
MCHC: 34.1 g/dL (ref 30.0–36.0)
MCV: 89.1 fL (ref 80.0–100.0)
Monocytes Absolute: 0.4 10*3/uL (ref 0.1–1.0)
Monocytes Relative: 7 %
Neutro Abs: 2.7 10*3/uL (ref 1.7–7.7)
Neutrophils Relative %: 55 %
Platelets: 256 10*3/uL (ref 150–400)
RBC: 4.31 MIL/uL (ref 3.87–5.11)
RDW: 11.5 % (ref 11.5–15.5)
WBC: 5 10*3/uL (ref 4.0–10.5)
nRBC: 0 % (ref 0.0–0.2)

## 2019-05-01 LAB — COMPREHENSIVE METABOLIC PANEL
ALT: 17 U/L (ref 0–44)
AST: 28 U/L (ref 15–41)
Albumin: 4.1 g/dL (ref 3.5–5.0)
Alkaline Phosphatase: 54 U/L (ref 38–126)
Anion gap: 8 (ref 5–15)
BUN: <5 mg/dL — ABNORMAL LOW (ref 6–20)
CO2: 25 mmol/L (ref 22–32)
Calcium: 9.4 mg/dL (ref 8.9–10.3)
Chloride: 107 mmol/L (ref 98–111)
Creatinine, Ser: 0.66 mg/dL (ref 0.44–1.00)
GFR calc Af Amer: >60 mL/min (ref >60)
GFR calc non Af Amer: >60 mL/min (ref >60)
Glucose, Bld: 134 mg/dL — ABNORMAL HIGH (ref 70–99)
Potassium: 3.6 mmol/L (ref 3.5–5.1)
Sodium: 140 mmol/L (ref 135–145)
Total Bilirubin: 1.7 mg/dL — ABNORMAL HIGH (ref 0.3–1.2)
Total Protein: 7.5 g/dL (ref 6.5–8.1)

## 2019-05-01 LAB — URINALYSIS, COMPLETE (UACMP) WITH MICROSCOPIC
Bilirubin Urine: NEGATIVE
Glucose, UA: NEGATIVE mg/dL
Hgb urine dipstick: NEGATIVE
Ketones, ur: NEGATIVE mg/dL
Nitrite: NEGATIVE
Protein, ur: NEGATIVE mg/dL
Specific Gravity, Urine: 1.004 — ABNORMAL LOW (ref 1.005–1.030)
pH: 6 (ref 5.0–8.0)

## 2019-05-01 LAB — POC URINE PREG, ED: Preg Test, Ur: NEGATIVE

## 2019-05-01 MED ORDER — MORPHINE SULFATE (PF) 4 MG/ML IV SOLN
4.0000 mg | INTRAVENOUS | Status: DC | PRN
  Administered 2019-05-01: 4 mg via INTRAVENOUS
  Filled 2019-05-01: qty 1

## 2019-05-01 MED ORDER — HYDROCODONE-ACETAMINOPHEN 5-325 MG PO TABS: 1.0000 | ORAL_TABLET | ORAL | 0 refills | Status: DC | PRN

## 2019-05-01 MED ORDER — ONDANSETRON HCL 4 MG/2ML IJ SOLN
4.0000 mg | Freq: Once | INTRAMUSCULAR | Status: AC
Start: 2019-05-01 — End: 2019-05-01
  Administered 2019-05-01: 15:00:00 4 mg via INTRAVENOUS
  Filled 2019-05-01: qty 2

## 2019-05-01 NOTE — ED Triage Notes (Signed)
Pt here with co RLQ pain, was seen at [facility] yesterday for the same. CT scan, blood work, and urine negative there, is here today for recurring pain, denies hx of ovarian cysts. NAD.

## 2019-05-01 NOTE — ED Provider Notes (Signed)
[facility] Emergency Department Provider Note    First MD Initiated Contact with Patient 05/01/19 1401     (approximate)  I have reviewed the triage vital signs and the nursing notes.   HISTORY  Chief Complaint Abdominal Pain    HPI Judith Mckinney is a 23 y.o. female below listed past medical history  presents to the ER for progressively worsening right lower quadrant pain.  [facility] for similar symptoms.  Had reassuring blood work as well as reassuring CT imaging.  States that the pain worsened overnight and she went back but was told that her be a long wait so she left presented to our ER.  Denies any fevers.  Says that she was "tubing "over the weekend but denies any specific injury.  Denies any vomiting.  Has noted some change in the color of her urine.  Denies any history of kidney stones.  No vaginal discharge.  No history of ovarian cysts.   Past Medical History:  Diagnosis Date   Medical history non-contributory    Psoriasis    No family history on file. Past Surgical History:  Procedure Laterality Date   NO PAST SURGERIES     Patient Active Problem List   Diagnosis Date Noted   Status post vacuum-assisted vaginal delivery 03/08/2015   Preeclampsia 03/07/2015   [redacted] weeks gestation of pregnancy    Fall       Prior to Admission medications   Medication Sig Start Date End Date Taking? Authorizing Provider  calcium carbonate (TUMS - DOSED IN MG ELEMENTAL CALCIUM) 500 MG chewable tablet Chew 1-2 tablets by mouth as needed for heartburn. Patient takes up to 5 times daily prn.    [provider]  diphenhydrAMINE (BENADRYL) 2 % cream Apply 1 application topically 3 (three) times daily as needed for itching.    [provider]  HYDROcodone-acetaminophen (NORCO) 5-325 MG tablet Take 1 tablet by mouth every 4 (four) hours as needed for moderate pain. 05/01/19   Willy Eddyobinson, Tyneisha Hegeman, MD     Allergies Neosporin [neomycin-bacitracin zn-polymyx]    Social History Social History   Tobacco Use   Smoking status: Former Smoker   Smokeless tobacco: Never Used  Substance Use Topics   Alcohol use: No   Drug use: No    Review of Systems Patient denies headaches, rhinorrhea, blurry vision, numbness, shortness of breath, chest pain, edema, cough, abdominal pain, nausea, vomiting, diarrhea, dysuria, fevers, rashes or hallucinations unless otherwise stated above in HPI. ____________________________________________   PHYSICAL EXAM:  VITAL SIGNS: Vitals:   05/01/19 1329  BP: 124/75  Pulse: 68  Temp: 98.9 F (37.2 C)  SpO2: 98%    Constitutional: Alert and oriented.  Eyes: Conjunctivae are normal.  Head: Atraumatic. Nose: No congestion/rhinnorhea. Mouth/Throat: Mucous membranes are moist.   Neck: No stridor. Painless ROM.  Cardiovascular: Normal rate, regular rhythm. Grossly normal heart sounds.  Good peripheral circulation. Respiratory: Normal respiratory effort.  No retractions. Lungs CTAB. Gastrointestinal: Soft with mild ttp in RLQ. No distention. No abdominal bruits. No CVA tenderness. Genitourinary:  Musculoskeletal: No lower extremity tenderness nor edema.  No joint effusions. Neurologic:  Normal speech and language. No gross focal neurologic deficits are appreciated. No facial droop Skin:  Skin is warm, dry and intact. No rash noted. Psychiatric: Mood and affect are normal. Speech and behavior are normal.  ____________________________________________   LABS (all labs ordered are listed, but only abnormal results are displayed)  Results for  orders placed or performed during the hospital encounter of 05/01/19 (from the past 24 hour(s))  CBC with Differential/Platelet     Status: None   Collection Time: 05/01/19  2:35 PM  Result Value Ref Range   WBC 5.0 4.0 - 10.5 K/uL   RBC 4.31 3.87 - 5.11 MIL/uL   Hemoglobin 13.1 12.0 - 15.0 g/dL   HCT 38.4  36.0 - 46.0 %   MCV 89.1 80.0 - 100.0 fL   MCH 30.4 26.0 - 34.0 pg   MCHC 34.1 30.0 - 36.0 g/dL   RDW 11.5 11.5 - 15.5 %   Platelets 256 150 - 400 K/uL   nRBC 0.0 0.0 - 0.2 %   Neutrophils Relative % 55 %   Neutro Abs 2.7 1.7 - 7.7 K/uL   Lymphocytes Relative 36 %   Lymphs Abs 1.8 0.7 - 4.0 K/uL   Monocytes Relative 7 %   Monocytes Absolute 0.4 0.1 - 1.0 K/uL   Eosinophils Relative 2 %   Eosinophils Absolute 0.1 0.0 - 0.5 K/uL   Basophils Relative 0 %   Basophils Absolute 0.0 0.0 - 0.1 K/uL   Immature Granulocytes 0 %   Abs Immature Granulocytes 0.01 0.00 - 0.07 K/uL  Comprehensive metabolic panel     Status: Abnormal   Collection Time: 05/01/19  2:35 PM  Result Value Ref Range   Sodium 140 135 - 145 mmol/L   Potassium 3.6 3.5 - 5.1 mmol/L   Chloride 107 98 - 111 mmol/L   CO2 25 22 - 32 mmol/L   Glucose, Bld 134 (H) 70 - 99 mg/dL   BUN <5 (L) 6 - 20 mg/dL   Creatinine, Ser 0.66 0.44 - 1.00 mg/dL   Calcium 9.4 8.9 - 10.3 mg/dL   Total Protein 7.5 6.5 - 8.1 g/dL   Albumin 4.1 3.5 - 5.0 g/dL   AST 28 15 - 41 U/L   ALT 17 0 - 44 U/L   Alkaline Phosphatase 54 38 - 126 U/L   Total Bilirubin 1.7 (H) 0.3 - 1.2 mg/dL   GFR calc non Af Amer >60 >60 mL/min   GFR calc Af Amer >60 >60 mL/min   Anion gap 8 5 - 15  Urinalysis, Complete w Microscopic     Status: Abnormal   Collection Time: 05/01/19  2:35 PM  Result Value Ref Range   Color, Urine YELLOW (A) YELLOW   APPearance CLEAR (A) CLEAR   Specific Gravity, Urine 1.004 (L) 1.005 - 1.030   pH 6.0 5.0 - 8.0   Glucose, UA NEGATIVE NEGATIVE mg/dL   Hgb urine dipstick NEGATIVE NEGATIVE   Bilirubin Urine NEGATIVE NEGATIVE   Ketones, ur NEGATIVE NEGATIVE mg/dL   Protein, ur NEGATIVE NEGATIVE mg/dL   Nitrite NEGATIVE NEGATIVE   Leukocytes,Ua SMALL (A) NEGATIVE   RBC / HPF 0-5 0 - 5 RBC/hpf   WBC, UA 0-5 0 - 5 WBC/hpf   Bacteria, UA FEW (A) NONE SEEN   Squamous Epithelial / LPF 0-5 0 - 5  POC Urine Pregnancy, ED     Status: None    Collection Time: 05/01/19  2:56 PM  Result Value Ref Range   Preg Test, Ur Negative Negative   ____________________________________________ ____________________________________________  RADIOLOGY  I personally reviewed all radiographic images ordered to evaluate for the above acute complaints and reviewed radiology reports and findings.  These findings were personally discussed with the patient.  Please see medical record for radiology report.  ____________________________________________   PROCEDURES  Procedure(s)  performed:  Procedures    Critical Care performed: no ____________________________________________   INITIAL IMPRESSION / ASSESSMENT AND PLAN / ED COURSE  Pertinent labs & imaging results that were available during my care of the patient were reviewed by me and considered in my medical decision making (see chart for details).   DDX: Stone cystitis, hernia, colitis, appendicitis, torsion, TOA, cyst, musculoskeletal strain  Beula E Petrey is a 23 y.o. who presents to the ED with symptoms as described above.  Patient well-appearing afebrile and hemodynamically stable.  Does have some mild tenderness to palpation in the right lower quadrant but certainly no rebound or guarding.  She is afebrile.  Repeat white count without any leukocytosis.  Given normal CT imaging yesterday have a lower suspicion for appendicitis.  Will evaluate for any evidence of torsion or pelvic abnormality patient denies any discharge.  May simply be musculoskeletal strain but do feel this warrants further evaluation.  Clinical Course as of May 01 1723  Tue May 01, 2019  1719 Patient reassessed.  Repeat abdominal exam soft and benign.  Negative obturator sign.  No pain with percussion of the leg.  Denies vaginal discharge or concern for STI.  Declining pelvic.  Have low suspicion for appendicitis given lack of fever white count with negative CT yesterday.  Ultrasound is reassuring.  Suspect some  component of musculoskeletal strain.  At this point believe she stable for continued observation as an outpatient.  We discussed signs and symptoms for which she should return to the ER.   [PR]    Clinical Course User Index [PR] Willy Eddy, MD    The patient was evaluated in Emergency Department today for the symptoms described in the history of present illness. He/she was evaluated in the context of the global COVID-19 pandemic, which necessitated consideration that the patient might be at risk for infection with the SARS-CoV-2 virus that causes COVID-19. Institutional protocols and algorithms that pertain to the evaluation of patients at risk for COVID-19 are in a state of rapid change based on information released by regulatory bodies including the CDC and federal and state organizations. These policies and algorithms were followed during the patient's care in the ED.  As part of my medical decision making, I reviewed the following data within the electronic MEDICAL RECORD NUMBER Nursing notes reviewed and incorporated, Labs reviewed, notes from prior ED visits and [place] Controlled Substance Database   ____________________________________________   FINAL CLINICAL IMPRESSION(S) / ED DIAGNOSES  Final diagnoses:  RLQ abdominal pain      NEW MEDICATIONS STARTED DURING THIS VISIT:  New Prescriptions   HYDROCODONE-ACETAMINOPHEN (NORCO) 5-325 MG TABLET    Take 1 tablet by mouth every 4 (four) hours as needed for moderate pain.     Note:  This document was prepared using Dragon voice recognition software and may include unintentional dictation errors.    Willy Eddy, MD 05/01/19 [phone]

## 2019-05-01 NOTE — ED Notes (Signed)
Pt ambulatory to bathroom without difficulty. NAD at this time. Family remains at bedside.

## 2019-05-01 NOTE — Discharge Instructions (Addendum)
You have been seen in the emergency department for emergency care. It is important that you contact your own doctor, specialist or the closest clinic for follow-up care. Please bring this instruction sheet, all medications and X-Deweese copies with you when you are seen for follow-up care. ° °Determining the exact cause for all patients with abdominal pain is extremely difficult in the emergency department. Our primary focus is to rule-out immediate life-threatening diseases. If no immediate source of pain is found the definitive diagnosis frequently needs to be determined over time.Many times your primary care physician can determine the cause by following the symptoms over time. Sometimes, specialist are required such as Gastroenterologists, Gynecologists, Urologists or Surgeons. Please return °immediately to the Emergency Department for fever>101, Vomiting or Intractable Pain. You should return to the emergency department or see your primary care provider in 12-24hrs if your pain is no better and °sooner if your pain becomes worse. ° °

## 2019-10-04 ENCOUNTER — Emergency Department: Payer: 59

## 2019-10-04 ENCOUNTER — Encounter: Payer: Self-pay | Admitting: Emergency Medicine

## 2019-10-04 ENCOUNTER — Other Ambulatory Visit: Payer: Self-pay

## 2019-10-04 ENCOUNTER — Emergency Department
Admission: EM | Admit: 2019-10-04 | Discharge: 2019-10-04 | Disposition: A | Discharge status: Home / Self Care | Payer: 59 | Attending: Emergency Medicine | Admitting: Emergency Medicine

## 2019-10-04 DIAGNOSIS — R1031 Right lower quadrant pain: Secondary | ICD-10-CM | POA: Insufficient documentation

## 2019-10-04 DIAGNOSIS — R197 Diarrhea, unspecified: Secondary | ICD-10-CM | POA: Insufficient documentation

## 2019-10-04 DIAGNOSIS — R1030 Lower abdominal pain, unspecified: Secondary | ICD-10-CM | POA: Diagnosis present

## 2019-10-04 DIAGNOSIS — R110 Nausea: Secondary | ICD-10-CM | POA: Insufficient documentation

## 2019-10-04 DIAGNOSIS — Z87891 Personal history of nicotine dependence: Secondary | ICD-10-CM | POA: Insufficient documentation

## 2019-10-04 LAB — COMPREHENSIVE METABOLIC PANEL
ALT: 19 U/L (ref 0–44)
AST: 27 U/L (ref 15–41)
Albumin: 4 g/dL (ref 3.5–5.0)
Alkaline Phosphatase: 52 U/L (ref 38–126)
Anion gap: 9 (ref 5–15)
BUN: 7 mg/dL (ref 6–20)
CO2: 25 mmol/L (ref 22–32)
Calcium: 8.8 mg/dL — ABNORMAL LOW (ref 8.9–10.3)
Chloride: 101 mmol/L (ref 98–111)
Creatinine, Ser: 0.69 mg/dL (ref 0.44–1.00)
GFR calc Af Amer: >60 mL/min (ref >60)
GFR calc non Af Amer: >60 mL/min (ref >60)
Glucose, Bld: 80 mg/dL (ref 70–99)
Potassium: 3.6 mmol/L (ref 3.5–5.1)
Sodium: 135 mmol/L (ref 135–145)
Total Bilirubin: 1.1 mg/dL (ref 0.3–1.2)
Total Protein: 7.6 g/dL (ref 6.5–8.1)

## 2019-10-04 LAB — CBC
HCT: 39.9 % (ref 36.0–46.0)
Hemoglobin: 13.8 g/dL (ref 12.0–15.0)
MCH: 30.7 pg (ref 26.0–34.0)
MCHC: 34.6 g/dL (ref 30.0–36.0)
MCV: 88.7 fL (ref 80.0–100.0)
Platelets: 251 10*3/uL (ref 150–400)
RBC: 4.5 MIL/uL (ref 3.87–5.11)
RDW: 11.6 % (ref 11.5–15.5)
WBC: 6.9 10*3/uL (ref 4.0–10.5)
nRBC: 0 % (ref 0.0–0.2)

## 2019-10-04 LAB — URINALYSIS, COMPLETE (UACMP) WITH MICROSCOPIC
Bilirubin Urine: NEGATIVE
Glucose, UA: NEGATIVE mg/dL
Hgb urine dipstick: NEGATIVE
Ketones, ur: NEGATIVE mg/dL
Nitrite: NEGATIVE
Protein, ur: NEGATIVE mg/dL
Specific Gravity, Urine: 1.005 (ref 1.005–1.030)
pH: 6 (ref 5.0–8.0)

## 2019-10-04 LAB — POCT PREGNANCY, URINE: Preg Test, Ur: NEGATIVE

## 2019-10-04 LAB — LIPASE, BLOOD: Lipase: 43 U/L (ref 11–51)

## 2019-10-04 MED ORDER — IOHEXOL 300 MG/ML  SOLN
100.0000 mL | Freq: Once | INTRAMUSCULAR | Status: AC | PRN
  Administered 2019-10-04: 100 mL via INTRAVENOUS

## 2019-10-04 MED ORDER — ACETAMINOPHEN 325 MG PO TABS
650.0000 mg | ORAL_TABLET | Freq: Once | ORAL | Status: AC
  Administered 2019-10-04: 650 mg via ORAL
  Filled 2019-10-04: qty 2

## 2019-10-04 MED ORDER — IOHEXOL 9 MG/ML PO SOLN
500.0000 mL | ORAL | Status: AC
  Administered 2019-10-04 (×2): 500 mL via ORAL

## 2019-10-04 NOTE — ED Provider Notes (Signed)
[facility] Emergency Department Provider Note ____________________________________________   First MD Initiated Contact with Patient 10/04/19 [phone]     (approximate)  I have reviewed the triage vital signs and the nursing notes.   HISTORY  Chief Complaint Abdominal Pain    HPI Venda E Brandenburger is a 24 y.o. female with PMH as noted below who presents with lower abdominal pain since last night, initially bandlike and near her umbilicus, and now more localized to the right lower abdomen and right flank.  The patient denies any prior history of this pain.  She reports nausea but no vomiting.  She has had several episodes of diarrhea since last night.  She denies any fever chills, urinary symptoms, or any vaginal bleeding or discharge.   Past Medical History:  Diagnosis Date  . Medical history non-contributory   . Psoriasis     Patient Active Problem List   Diagnosis Date Noted  . Status post vacuum-assisted vaginal delivery 03/08/2015  . Preeclampsia 03/07/2015  . [redacted] weeks gestation of pregnancy   . Fall     Past Surgical History:  Procedure Laterality Date  . NO PAST SURGERIES      Prior to Admission medications   Medication Sig Start Date End Date Taking? Authorizing Provider  calcium carbonate (TUMS - DOSED IN MG ELEMENTAL CALCIUM) 500 MG chewable tablet Chew 1-2 tablets by mouth as needed for heartburn. Patient takes up to 5 times daily prn.    [provider]  diphenhydrAMINE (BENADRYL) 2 % cream Apply 1 application topically 3 (three) times daily as needed for itching.    [provider]  HYDROcodone-acetaminophen (NORCO) 5-325 MG tablet Take 1 tablet by mouth every 4 (four) hours as needed for moderate pain. 05/01/19   Willy Eddy, MD    Allergies Neosporin [neomycin-bacitracin zn-polymyx]  No family history on file.  Social History Social History   Tobacco Use  . Smoking status: Former Games developer  . Smokeless  tobacco: Never Used  Substance Use Topics  . Alcohol use: No  . Drug use: No    Review of Systems  Constitutional: No fever. Eyes: No redness. ENT: No sore throat. Cardiovascular: Denies chest pain. Respiratory: Denies shortness of breath. Gastrointestinal: Positive for nausea and diarrhea.  Genitourinary: Negative for dysuria, vaginal bleeding or discharge.  Musculoskeletal: Negative for back pain. Skin: Negative for rash. Neurological: Negative for headache.   ____________________________________________   PHYSICAL EXAM:  VITAL SIGNS: ED Triage Vitals  Enc Vitals Group     BP 10/04/19 0826 125/75     Pulse Rate 10/04/19 0826 92     Resp 10/04/19 0826 20     Temp 10/04/19 0826 98.6 F (37 C)     Temp Source 10/04/19 0826 Oral     SpO2 10/04/19 0826 100 %     Weight 10/04/19 0827 195 lb (88.5 kg)     Height 10/04/19 0827 5\' 6"  (1.676 m)     Head Circumference --      Peak Flow --      Pain Score 10/04/19 0827 5     Pain Loc --      Pain Edu? --      Excl. in GC? --     Constitutional: Alert and oriented. Well appearing and in no acute distress. Eyes: Conjunctivae are normal.  Head: Atraumatic. Nose: No congestion/rhinnorhea. Mouth/Throat: Mucous membranes are moist.   Neck: Normal range of motion.  Cardiovascular:  Good peripheral circulation. Respiratory: Normal respiratory effort.  No retractions.  Gastrointestinal: Soft with mild right lower quadrant.  Tenderness no distention.  Genitourinary: No CVA tenderness.  Mild right flank tenderness.  Pelvic exam deferred. Musculoskeletal:  Extremities warm and well perfused.  Neurologic:  Normal speech and language. No gross focal neurologic deficits are appreciated.  Skin:  Skin is warm and dry. No rash noted. Psychiatric: Mood and affect are normal. Speech and behavior are normal.  ____________________________________________   LABS (all labs ordered are listed, but only abnormal results are  displayed)  Labs Reviewed  COMPREHENSIVE METABOLIC PANEL - Abnormal; Notable for the following components:      Result Value   Calcium 8.8 (*)    All other components within normal limits  URINALYSIS, COMPLETE (UACMP) WITH MICROSCOPIC - Abnormal; Notable for the following components:   Color, Urine STRAW (*)    APPearance HAZY (*)    Leukocytes,Ua SMALL (*)    Bacteria, UA RARE (*)    All other components within normal limits  LIPASE, BLOOD  CBC  POC URINE PREG, ED  POCT PREGNANCY, URINE   ____________________________________________  EKG   ____________________________________________  RADIOLOGY  CT abdomen: Normal appendix.  No other acute abnormality.  ____________________________________________   PROCEDURES  Procedure(s) performed: No  Procedures  Critical Care performed: No ____________________________________________   INITIAL IMPRESSION / ASSESSMENT AND PLAN / ED COURSE  Pertinent labs & imaging results that were available during my care of the patient were reviewed by me and considered in my medical decision making (see chart for details).  24 year old female with PMH as noted above presents with acute onset of initially periumbilical and now more right lower abdominal and flank pain since last night associated with nausea and a few episodes of diarrhea but no fever.  The patient denies associated urinary symptoms, vaginal bleeding, or discharge.  I reviewed the past medical records in epic and see that the patient was seen in the ED here and at [facility] in September 2020 for abdominal pain and had a negative CT and ultrasound at that time.  The patient states that that pain was quite different in quality and location and was significantly worse.  On exam today the patient is overall well-appearing with normal vital signs.  Her abdomen is soft with mild right lower quadrant and right flank tenderness.  She was offered but declines pelvic exam, and has no symptoms  to suggest a gynecologic etiology.  Lab work-up obtained from triage is within normal limits.  Given the improving symptoms and the reassuring lab work-up, I have a relatively low suspicion for acute appendicitis although the tenderness in the right lower quadrant and the migration of the pain to this area does concern me somewhat.  Using shared decision-making, I offered the patient observation for a few hours and subsequent reassessment, versus proceeding to imaging directly and she opted for the latter.  We will obtain a CT to rule out acute appendicitis or other acute intra-abdominal cause.  ----------------------------------------- 12:03 PM on 10/04/2019 -----------------------------------------  CT shows a normal appendix and no other acute findings.  On reassessment, the patient reports minimal pain and states she feels well.  She has been tolerating p.o.  At this time, she is stable for discharge home.  I counseled her on the results of the work-up.  Return precautions given, and she expresses understanding.  ____________________________________________   FINAL CLINICAL IMPRESSION(S) / ED DIAGNOSES  Final diagnoses:  Lower abdominal pain      NEW MEDICATIONS STARTED DURING THIS VISIT:  New Prescriptions   No medications on file     Note:  This document was prepared using Dragon voice recognition software and may include unintentional dictation errors.    Arta Silence, MD 10/04/19 1204

## 2019-10-04 NOTE — Discharge Instructions (Addendum)
Continue to take Tylenol or ibuprofen as needed.  Return to the ER for new, worsening, or persistent severe pain, vomiting, fever, weakness, vaginal bleeding or discharge, urinary symptoms, or any other new or worsening symptoms that concern you.

## 2019-10-04 NOTE — ED Triage Notes (Signed)
Patient to ER for c/o abd pain that began at umbilicus, but has now become generalized lower abd pain with radiating pain into bilateral flanks.

## 2020-02-18 LAB — HM PAP SMEAR: HM Pap smear: NORMAL

## 2021-02-11 IMAGING — US US PELVIS COMPLETE TRANSABD/TRANSVAG W DUPLEX
1 series · 13 of 25 positions shown · non-contrast
Comparison: None.

CLINICAL DATA: Right lower quadrant pain

EXAM:
TRANSABDOMINAL AND TRANSVAGINAL ULTRASOUND OF PELVIS
DOPPLER ULTRASOUND OF OVARIES
TECHNIQUE: Both transabdominal and transvaginal ultrasound examinations of the
pelvis were performed. Transabdominal technique was performed for
global imaging of the pelvis including uterus, ovaries, adnexal
regions, and pelvic cul-de-sac.
It was necessary to proceed with endovaginal exam following the
transabdominal exam to visualize the uterus endometrium ovaries.
Color and duplex Doppler ultrasound was utilized to evaluate blood
flow to the ovaries.

[Series 1: us pelvis complete transabd/transvag w duplex · 102 acquisitions, 13 frames shown]
[im 1/102]
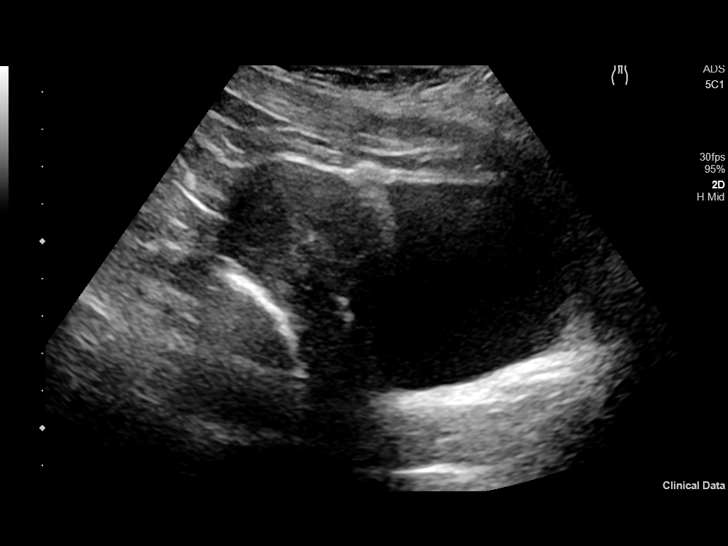
[im 9/102]
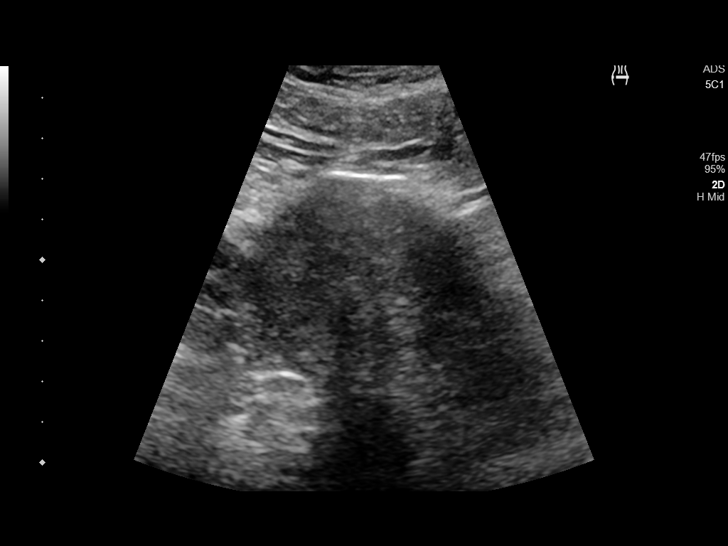
[im 17/102]
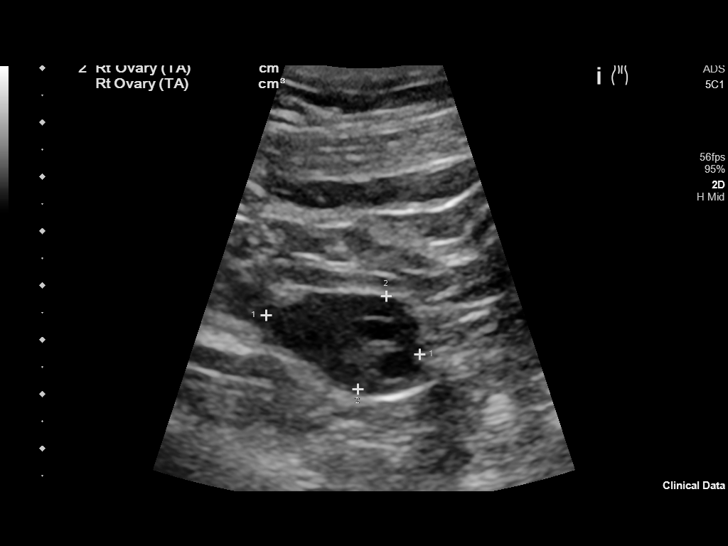
[im 26/102]
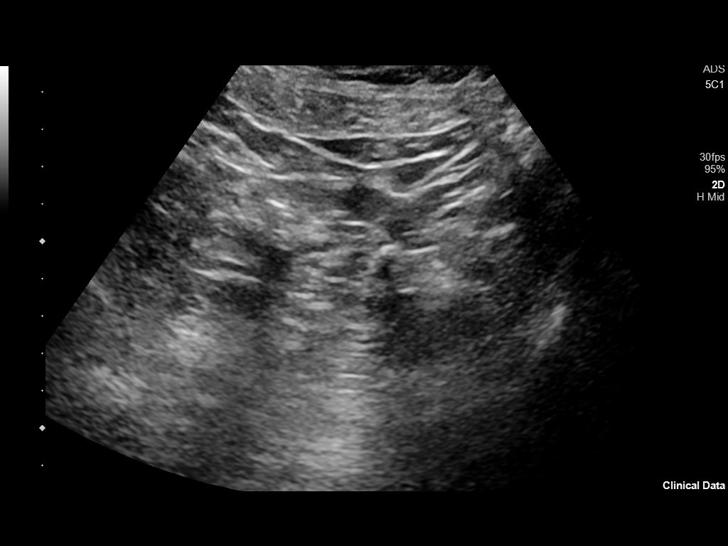
[im 34/102]
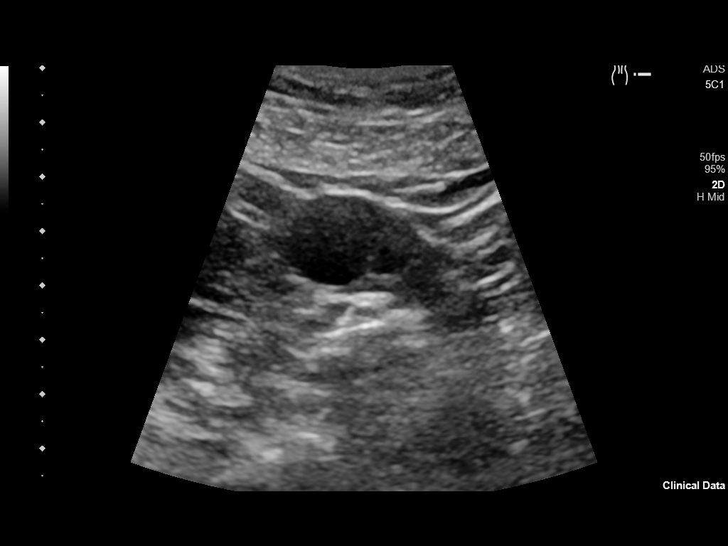
[im 43/102]
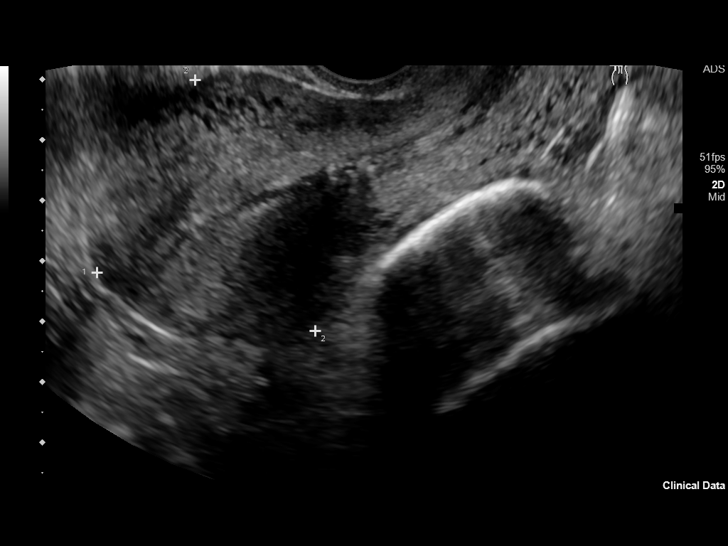
[im 51/102]
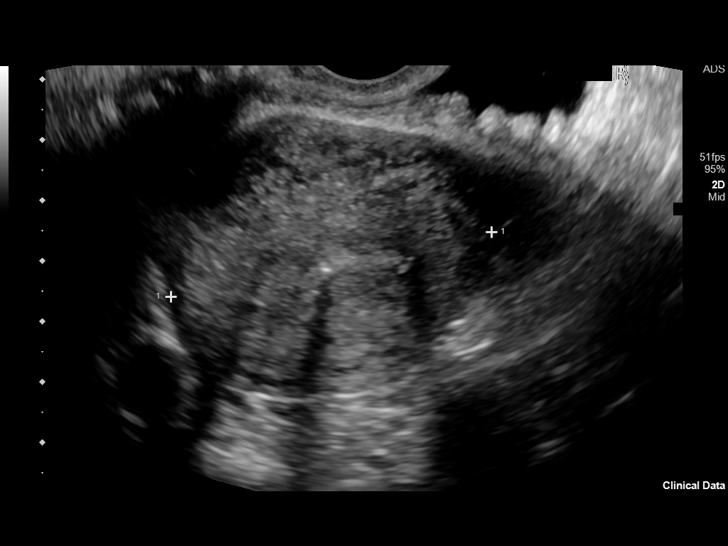
[im 59/102]
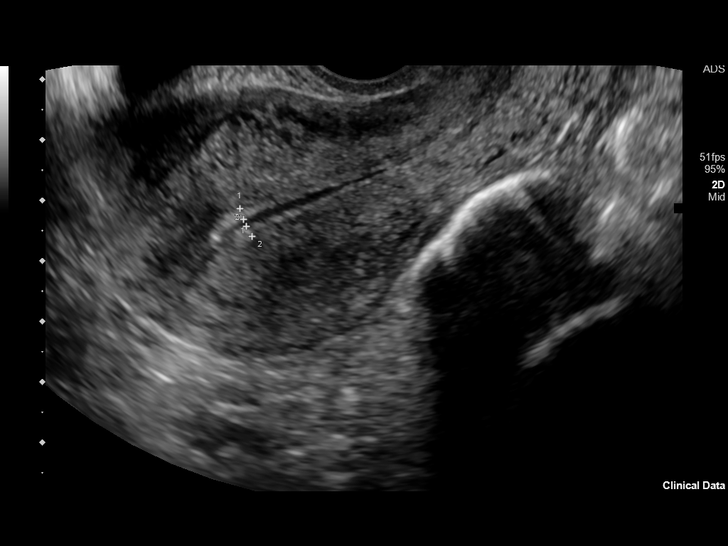
[im 68/102]
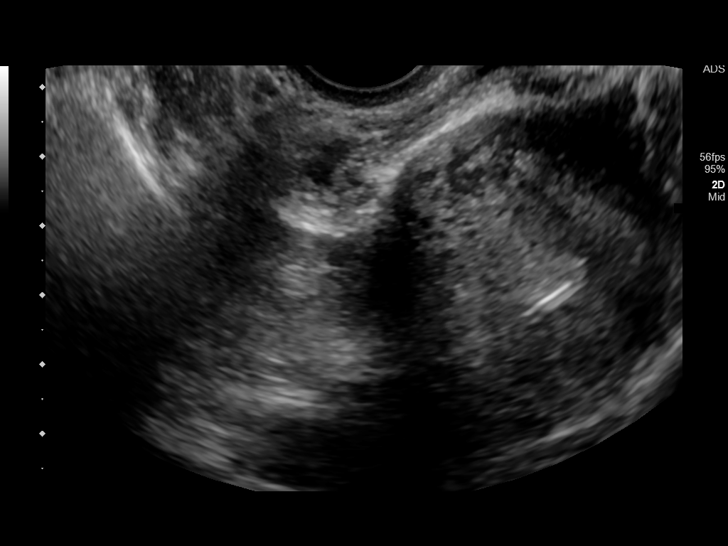
[im 76/102]
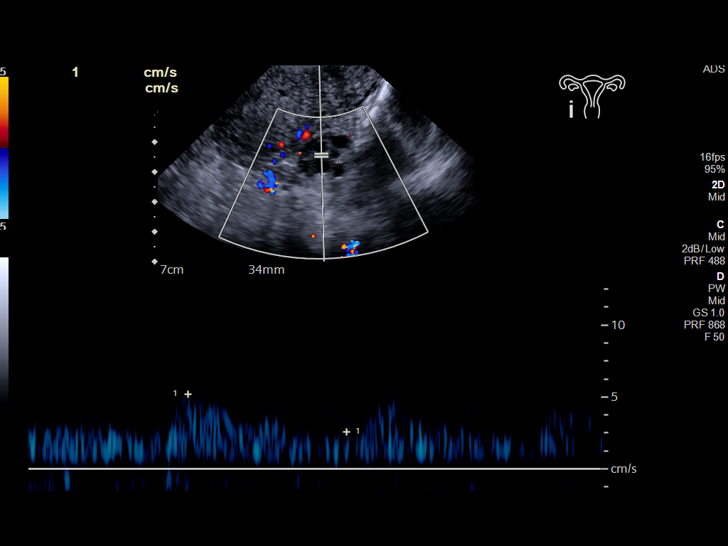
[im 85/102]
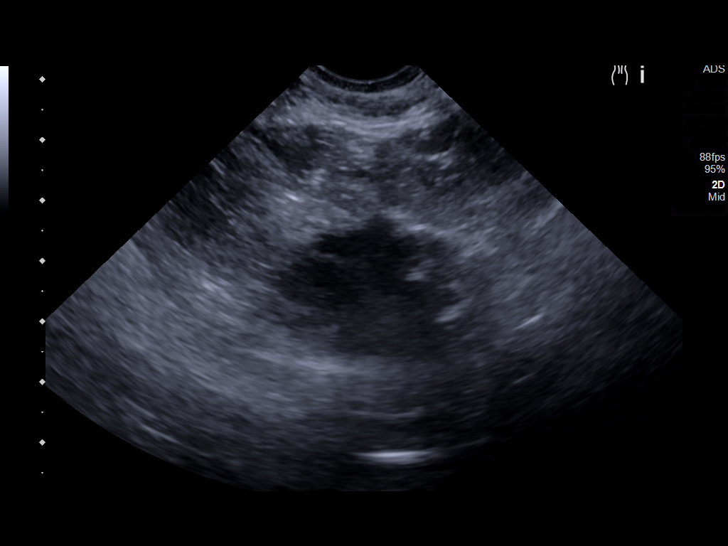
[im 93/102]
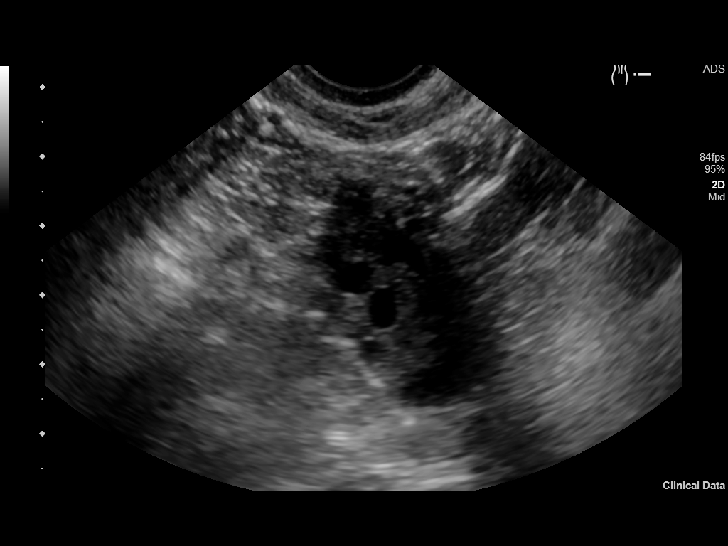
[im 102/102]
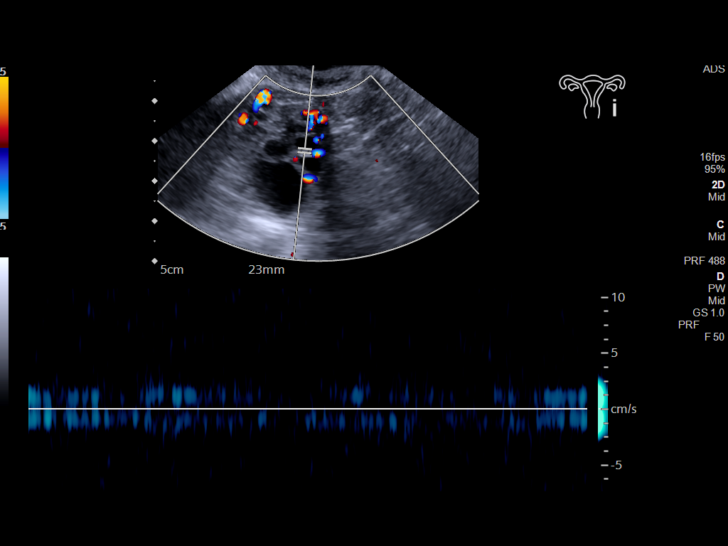

[13 of 25 positions shown; findings below may reference images not displayed]

FINDINGS: Uterus

Measurements: 9.2 x 4.6 x 5.4 cm = volume: 120.3 mL. No fibroids or
other mass visualized.

Endometrium

Thickness: 4 mm. Intrauterine device is in place. Possible tiny
amount of fluid within the endometrial canal.

Right ovary

Measurements: 2.9 x 1.9 x 2 cm = volume: 6 mL. Normal appearance/no
adnexal mass.

Left ovary

Measurements: 3.1 x 2.2 x 1.6 cm = volume: 5.8 mL. Dominant follicle
left ovary.

Pulsed Doppler evaluation of both ovaries demonstrates normal
low-resistance arterial and venous waveforms.

Other findings

No abnormal free fluid.
IMPRESSION: 1. Negative for ovarian torsion.
2. Intrauterine device appears appropriately positioned by
sonography.

## 2021-05-18 ENCOUNTER — Ambulatory Visit: Payer: Self-pay | Admitting: Family Medicine

## 2021-05-18 ENCOUNTER — Other Ambulatory Visit: Payer: Self-pay

## 2022-07-07 ENCOUNTER — Encounter: Payer: Self-pay | Admitting: Emergency Medicine

## 2022-07-07 ENCOUNTER — Other Ambulatory Visit: Payer: Self-pay

## 2022-07-07 ENCOUNTER — Emergency Department
Admission: EM | Admit: 2022-07-07 | Discharge: 2022-07-07 | Disposition: A | Discharge status: Home / Self Care | Payer: 59 | Attending: Emergency Medicine | Admitting: Emergency Medicine

## 2022-07-07 ENCOUNTER — Encounter: Payer: Self-pay | Admitting: Surgery

## 2022-07-07 ENCOUNTER — Ambulatory Visit (INDEPENDENT_AMBULATORY_CARE_PROVIDER_SITE_OTHER): Payer: 59 | Admitting: Surgery

## 2022-07-07 ENCOUNTER — Emergency Department: Payer: 59

## 2022-07-07 VITALS — BP 116/80 | HR 63 | Temp 98.6°F | Ht 67.0 in | Wt 205.6 lb

## 2022-07-07 DIAGNOSIS — K8020 Calculus of gallbladder without cholecystitis without obstruction: Secondary | ICD-10-CM

## 2022-07-07 DIAGNOSIS — K8070 Calculus of gallbladder and bile duct without cholecystitis without obstruction: Secondary | ICD-10-CM | POA: Diagnosis not present

## 2022-07-07 DIAGNOSIS — N390 Urinary tract infection, site not specified: Secondary | ICD-10-CM

## 2022-07-07 DIAGNOSIS — R1010 Upper abdominal pain, unspecified: Secondary | ICD-10-CM

## 2022-07-07 DIAGNOSIS — R1012 Left upper quadrant pain: Secondary | ICD-10-CM | POA: Diagnosis present

## 2022-07-07 DIAGNOSIS — K8050 Calculus of bile duct without cholangitis or cholecystitis without obstruction: Secondary | ICD-10-CM

## 2022-07-07 HISTORY — DX: Urinary tract infection, site not specified: N39.0

## 2022-07-07 LAB — CBC
HCT: 40 % (ref 36.0–46.0)
Hemoglobin: 14.1 g/dL (ref 12.0–15.0)
MCH: 31.3 pg (ref 26.0–34.0)
MCHC: 35.3 g/dL (ref 30.0–36.0)
MCV: 88.7 fL (ref 80.0–100.0)
Platelets: 280 10*3/uL (ref 150–400)
RBC: 4.51 MIL/uL (ref 3.87–5.11)
RDW: 11.9 % (ref 11.5–15.5)
WBC: 7.3 10*3/uL (ref 4.0–10.5)
nRBC: 0 % (ref 0.0–0.2)

## 2022-07-07 LAB — PREGNANCY, URINE: Preg Test, Ur: NEGATIVE

## 2022-07-07 LAB — URINALYSIS, ROUTINE W REFLEX MICROSCOPIC
Bilirubin Urine: NEGATIVE
Glucose, UA: NEGATIVE mg/dL
Hgb urine dipstick: NEGATIVE
Ketones, ur: NEGATIVE mg/dL
Nitrite: NEGATIVE
Protein, ur: NEGATIVE mg/dL
Specific Gravity, Urine: 1.018 (ref 1.005–1.030)
pH: 6 (ref 5.0–8.0)

## 2022-07-07 LAB — COMPREHENSIVE METABOLIC PANEL
ALT: 14 U/L (ref 0–44)
AST: 21 U/L (ref 15–41)
Albumin: 4.3 g/dL (ref 3.5–5.0)
Alkaline Phosphatase: 53 U/L (ref 38–126)
Anion gap: 5 (ref 5–15)
BUN: 7 mg/dL (ref 6–20)
CO2: 27 mmol/L (ref 22–32)
Calcium: 9.1 mg/dL (ref 8.9–10.3)
Chloride: 106 mmol/L (ref 98–111)
Creatinine, Ser: 0.71 mg/dL (ref 0.44–1.00)
GFR, Estimated: >60 mL/min (ref >60)
Glucose, Bld: 102 mg/dL — ABNORMAL HIGH (ref 70–99)
Potassium: 3.5 mmol/L (ref 3.5–5.1)
Sodium: 138 mmol/L (ref 135–145)
Total Bilirubin: 1.1 mg/dL (ref 0.3–1.2)
Total Protein: 8.3 g/dL — ABNORMAL HIGH (ref 6.5–8.1)

## 2022-07-07 LAB — LIPASE, BLOOD: Lipase: 59 U/L — ABNORMAL HIGH (ref 11–51)

## 2022-07-07 MED ORDER — OXYCODONE-ACETAMINOPHEN 5-325 MG PO TABS
1.0000 | ORAL_TABLET | ORAL | 0 refills | Status: DC | PRN
  Filled 2022-07-07: qty 30, 5d supply, fill #0

## 2022-07-07 MED ORDER — ONDANSETRON HCL 4 MG/2ML IJ SOLN
4.0000 mg | Freq: Once | INTRAMUSCULAR | Status: AC
  Administered 2022-07-07: 4 mg via INTRAVENOUS
  Filled 2022-07-07: qty 2

## 2022-07-07 MED ORDER — ONDANSETRON 4 MG PO TBDP
4.0000 mg | ORAL_TABLET | Freq: Three times a day (TID) | ORAL | 0 refills | Status: DC | PRN
  Filled 2022-07-07: qty 30, 10d supply, fill #0

## 2022-07-07 MED ORDER — FAMOTIDINE IN NACL 20-0.9 MG/50ML-% IV SOLN
20.0000 mg | Freq: Once | INTRAVENOUS | Status: AC
  Administered 2022-07-07: 20 mg via INTRAVENOUS
  Filled 2022-07-07: qty 50

## 2022-07-07 MED ORDER — CEPHALEXIN 500 MG PO CAPS
500.0000 mg | ORAL_CAPSULE | Freq: Three times a day (TID) | ORAL | 0 refills | Status: DC
  Filled 2022-07-07: qty 21, 7d supply, fill #0

## 2022-07-07 MED ORDER — CEPHALEXIN 500 MG PO CAPS: 500.0000 mg | ORAL_CAPSULE | Freq: Three times a day (TID) | ORAL | 0 refills | Status: DC

## 2022-07-07 MED ORDER — SODIUM CHLORIDE 0.9 % IV BOLUS
1000.0000 mL | Freq: Once | INTRAVENOUS | Status: AC
  Administered 2022-07-07: 1000 mL via INTRAVENOUS

## 2022-07-07 MED ORDER — OXYCODONE-ACETAMINOPHEN 5-325 MG PO TABS: 1.0000 | ORAL_TABLET | ORAL | 0 refills | Status: DC | PRN

## 2022-07-07 MED ORDER — KETOROLAC TROMETHAMINE 30 MG/ML IJ SOLN
15.0000 mg | Freq: Once | INTRAMUSCULAR | Status: AC
  Administered 2022-07-07: 15 mg via INTRAVENOUS
  Filled 2022-07-07: qty 1

## 2022-07-07 MED ORDER — ONDANSETRON 4 MG PO TBDP: 4.0000 mg | ORAL_TABLET | Freq: Three times a day (TID) | ORAL | 0 refills | Status: DC | PRN

## 2022-07-07 NOTE — Discharge Instructions (Addendum)
1. Take medicines as needed for pain & nausea (Percocet/Zofran #30). 2. Clear liquids x 12 hours, then bland diet x 1 week, then slowly advance diet as tolerated. Avoid fatty, greasy, spicy foods and drinks. 3.  Take antibiotic as prescribed (Keflex 500mg  three times daily x 7 days). 4.  Return to the ER for worsening symptoms, persistent vomiting, fever, difficulty breathing or other concerns.

## 2022-07-07 NOTE — Patient Instructions (Signed)
Our surgery scheduler Barbara will call you within 24-48 hours to get you scheduled. If you have not heard from her after 48 hours, please call our office. Have the blue sheet available when she calls to write down important information.   If you have any concerns or questions, please feel free to call our office.    Minimally Invasive Cholecystectomy  Minimally invasive cholecystectomy is surgery to remove the gallbladder. The gallbladder is a pear-shaped organ that lies beneath the liver on the right side of the body. The gallbladder stores bile, which is a fluid that helps the body digest fats. Cholecystectomy is often done to treat inflammation (irritation and swelling) of the gallbladder (cholecystitis). This condition is usually caused by a buildup of gallstones (cholelithiasis) in the gallbladder or when the fluid in the gall bladder becomes stagnant because gallstones get stuck in the ducts (tubes) and block the flow of bile. This can result in inflammation and pain. In severe cases, emergency surgery may be required. This procedure is done through small incisions in the abdomen, instead of one large incision. It is also called laparoscopic surgery. A thin scope with a camera (laparoscope) is inserted through one incision. Then surgical instruments are inserted through the other incisions. In some cases, a minimally invasive surgery may need to be changed to a surgery that is done through a larger incision. This is called open surgery. Tell a health care provider about: Any allergies you have. All medicines you are taking, including vitamins, herbs, eye drops, creams, and over-the-counter medicines. Any problems you or family members have had with anesthetic medicines. Any bleeding problems you have. Any surgeries you have had. Any medical conditions you have. Whether you are pregnant or may be pregnant. What are the risks? Generally, this is a safe procedure. However, problems may occur,  including: Infection. Bleeding. Allergic reactions to medicines. Damage to nearby structures or organs. A gallstone remaining in the common bile duct. The common bile duct carries bile from the gallbladder to the small intestine. A bile leak from the liver or cystic duct after your gallbladder is removed. What happens before the procedure? When to stop eating and drinking Follow instructions from your health care provider about what you may eat and drink before your procedure. These may include: 8 hours before the procedure Stop eating most foods. Do not eat meat, fried foods, or fatty foods. Eat only light foods, such as toast or crackers. All liquids are okay except energy drinks and alcohol. 6 hours before the procedure Stop eating. Drink only clear liquids, such as water, clear fruit juice, black coffee, plain tea, and sports drinks. Do not drink energy drinks or alcohol. 2 hours before the procedure Stop drinking all liquids. You may be allowed to take medicines with small sips of water. If you do not follow your health care provider's instructions, your procedure may be delayed or canceled. Medicines Ask your health care provider about: Changing or stopping your regular medicines. This is especially important if you are taking diabetes medicines or blood thinners. Taking medicines such as aspirin and ibuprofen. These medicines can thin your blood. Do not take these medicines unless your health care provider tells you to take them. Taking over-the-counter medicines, vitamins, herbs, and supplements. General instructions If you will be going home right after the procedure, plan to have a responsible adult: Take you home from the hospital or clinic. You will not be allowed to drive. Care for you for the time you are   told. Do not use any products that contain nicotine or tobacco for at least 4 weeks before the procedure. These products include cigarettes, chewing tobacco, and vaping  devices, such as e-cigarettes. If you need help quitting, ask your health care provider. Ask your health care provider: How your surgery site will be marked. What steps will be taken to help prevent infection. These may include: Removing hair at the surgery site. Washing skin with a germ-killing soap. Taking antibiotic medicine. What happens during the procedure?  An IV will be inserted into one of your veins. You will be given one or both of the following: A medicine to help you relax (sedative). A medicine to make you fall asleep (general anesthetic). Your surgeon will make several small incisions in your abdomen. The laparoscope will be inserted through one of the small incisions. The camera on the laparoscope will send images to a monitor in the operating room. This lets your surgeon see inside your abdomen. A gas will be pumped into your abdomen. This will expand your abdomen to give the surgeon more room to perform the surgery. Other tools that are needed for the procedure will be inserted through the other incisions. The gallbladder will be removed through one of the incisions. Your common bile duct may be examined. If stones are found in the common bile duct, they may be removed. After your gallbladder has been removed, the incisions will be closed with stitches (sutures), staples, or skin glue. Your incisions will be covered with a bandage (dressing). The procedure may vary among health care providers and hospitals. What happens after the procedure? Your blood pressure, heart rate, breathing rate, and blood oxygen level will be monitored until you leave the hospital or clinic. You will be given medicines as needed to control your pain. You may have a drain placed in the incision. The drain will be removed a day or two after the procedure. Summary Minimally invasive cholecystectomy, also called laparoscopic cholecystectomy, is surgery to remove the gallbladder using small  incisions. Tell your health care provider about all the medical conditions you have and all the medicines you are taking for those conditions. Before the procedure, follow instructions about when to stop eating and drinking and changing or stopping medicines. Plan to have a responsible adult care for you for the time you are told after you leave the hospital or clinic. This information is not intended to replace advice given to you by your health care provider. Make sure you discuss any questions you have with your health care provider. Document Revised: 02/10/2021 Document Reviewed: 02/10/2021 Elsevier Patient Education  2023 [employer]vier Inc.  

## 2022-07-07 NOTE — Progress Notes (Signed)
Patient ID: Judith Mckinney, female   DOB: 06/15/1996, 26 y.o.   MRN: [id number]  HPI Judith Mckinney is a 26 y.o. female Dr. Sung.  She presented last night to the emergency room with upper abdominal pain that radiated to her left back.  Reports that the pain is intermittent moderate to severe intensity.  She did have also nausea and vomiting last night.  She did have a bacon biscuit for supper last night she was doing breakfast for dinner. Of note this is the fourth episode.  First episode occurred about a month ago and she went to the ER in [place].  No ultrasound was done at that time.  Since then she had another 3 episodes with the last one being last night/early this morning. She has been doing well and reports no abdominal pain.  She actually came to work.  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 Denies any fevers any chills no evidence of biliary obstruction. SHe did have an ultrasound that I personally reviewed showing evidence of gallstones without cholecystitis normal common bile duct.  CBC and LFTs were normal.  Did have a UTI. Denies any prior abdominal operations.  She is able to perform more than 4 METS of activity without any shortness of or chest pain. Father and grandmother with hx biliary colic responded well to cholecystectomy.   HPI  Past Medical History:  Diagnosis Date   Medical history non-contributory    Psoriasis     Past Surgical History:  Procedure Laterality Date   NO PAST SURGERIES      History reviewed. No pertinent family history.  Social History Social History   Tobacco Use   Smoking status: Former   Smokeless tobacco: Never  Vaping Use   Vaping Use: Every day  Substance Use Topics   Alcohol use: No   Drug use: No    Allergies  Allergen Reactions   Benzalkonium Chloride Rash   Neomycin-Bacitracin Zn-Polymyx Rash    Current Outpatient Medications  Medication Sig Dispense Refill   calcium carbonate (TUMS - DOSED  IN MG ELEMENTAL CALCIUM) 500 MG chewable tablet Chew 1-2 tablets by mouth as needed for heartburn. Patient takes up to 5 times daily prn.     cephALEXin (KEFLEX) 500 MG capsule Take 1 capsule (500 mg total) by mouth 3 (three) times daily. 21 capsule 0   diphenhydrAMINE (BENADRYL) 2 % cream Apply 1 application topically 3 (three) times daily as needed for itching.     levonorgestrel (MIRENA, 52 MG,) 20 MCG/DAY IUD Mirena 20 mcg/24 hours (8 yrs) 52 mg intrauterine device  Take by intrauterine route.     ondansetron (ZOFRAN-ODT) 4 MG disintegrating tablet Take 1 tablet (4 mg total) by mouth every 8 (eight) hours as needed for nausea or vomiting. 30 tablet 0   oxyCODONE-acetaminophen (PERCOCET/ROXICET) 5-325 MG tablet Take 1 tablet by mouth every 4 (four) hours as needed for severe pain 30 tablet 0   No current facility-administered medications for this visit.     Review of Systems Full ROS  was asked and was negative except for the information on the HPI  Physical Exam Blood pressure 116/80, pulse 63, temperature 98.6 F (37 C), temperature source Oral, height 5' 7" (1.702 m), weight 205 lb 9.6 oz (93.3 kg), last menstrual period 06/30/2022, SpO2 100 %. CONSTITUTIONAL: NAD BMI 32.2. EYES: Pupils are equal, round, a Sclera are non-icteric. EARS, NOSE, MOUTH AND THROAT: The oral mucosa is pink   and moist. Hearing is intact to voice. LYMPH NODES:  Lymph nodes in the neck are normal. RESPIRATORY:  Lungs are clear. There is normal respiratory effort, with equal breath sounds bilaterally, and without pathologic use of accessory muscles. CARDIOVASCULAR: Heart is regular without murmurs, gallops, or rubs. GI: The abdomen is  soft, nontender, and nondistended. There are no palpable masses. There is no hepatosplenomegaly. There are normal bowel sounds .  Negative Murphy sign GU: Rectal deferred.   MUSCULOSKELETAL: Normal muscle strength and tone. No cyanosis or edema.   SKIN: Turgor is good and there  are no pathologic skin lesions or ulcers. NEUROLOGIC: Motor and sensation is grossly normal. Cranial nerves are grossly intact. PSYCH:  Oriented to person, place and time. Affect is normal.  Data Reviewed  I have personally reviewed the patient's imaging, laboratory findings and medical records.    Assessment/Plan Symptomatic cholelithiasis recurrent vs Chronic cholecystitis.  Gust with the patient detail about her disease process. I do recommend cholecystectomy and she will be a good candidate for robotic approach. She wishes to have it done ASAP, next opening is next week. The risks, benefits, complications, treatment options, and expected outcomes were discussed with the patient. The possibilities of bleeding, recurrent infection, finding a normal gallbladder, perforation of viscus organs, damage to surrounding structures, bile leak, abscess formation, needing a drain placed, the need for additional procedures, reaction to medication, pulmonary aspiration,  failure to diagnose a condition, the possible need to convert to an open procedure, and creating a complication requiring transfusion or operation were discussed with the patient. The patient and/or family concurred with the proposed plan, giving informed consent.   A copy of this report was sent to the referring provider.  Sterling Big, MD FACS General Surgeon 07/07/2022, 2:39 PM

## 2022-07-07 NOTE — ED Provider Notes (Signed)
[facility] Provider Note    Event Date/Time   First MD Initiated Contact with Patient 07/07/22 0216     (approximate)   History   Abdominal Pain   HPI  Cilicia E Earnhart is a 26 y.o. female who presents to the ED from home with a chief complaint of upper abdominal pain radiating around her left side to her back.  Pain began after dinner which consisted of breakfast foods.  Vomited x1.  Took ibuprofen with partial relief of symptoms.  Similar symptoms in January, seen at [facility] and was told probably gallbladder but did not have imaging at that time.  Denies fever/chills, chest pain, shortness of breath, diarrhea.     Past Medical History   Past Medical History:  Diagnosis Date   Medical history non-contributory    Psoriasis      Active Problem List   Patient Active Problem List   Diagnosis Date Noted   Status post vacuum-assisted vaginal delivery 03/08/2015   Preeclampsia 03/07/2015   [redacted] weeks gestation of pregnancy    Fall      Past Surgical History   Past Surgical History:  Procedure Laterality Date   NO PAST SURGERIES       Home Medications   Prior to Admission medications   Medication Sig Start Date End Date Taking? Authorizing Provider  buPROPion (WELLBUTRIN SR) 150 MG 12 hr tablet PLEASE SEE ATTACHED FOR DETAILED DIRECTIONS 02/19/21   [provider]  calcium carbonate (TUMS - DOSED IN MG ELEMENTAL CALCIUM) 500 MG chewable tablet Chew 1-2 tablets by mouth as needed for heartburn. Patient takes up to 5 times daily prn.    [provider]  diphenhydrAMINE (BENADRYL) 2 % cream Apply 1 application topically 3 (three) times daily as needed for itching.    [provider]  HYDROcodone-acetaminophen (NORCO) 5-325 MG tablet Take 1 tablet by mouth every 4 (four) hours as needed for moderate pain. 05/01/19   Willy Eddy, MD  levonorgestrel (MIRENA, 52 MG,) 20 MCG/DAY IUD Mirena 20 mcg/24 hours (8 yrs) 52 mg  intrauterine device  Take by intrauterine route.    [provider]  nicotine (NICODERM CQ - DOSED IN MG/24 HOURS) 14 mg/24hr patch Place onto the skin. 02/19/21   [provider]     Allergies  Benzalkonium chloride and Neomycin-bacitracin zn-polymyx   Family History  No family history on file.   Physical Exam  Triage Vital Signs: ED Triage Vitals [07/07/22 0211]  Enc Vitals Group     BP (!) 170/102     Pulse Rate 66     Resp 20     Temp 97.6 F (36.4 C)     Temp Source Oral     SpO2 100 %     Weight 203 lb (92.1 kg)     Height 5\' 7"  (1.702 m)     Head Circumference      Peak Flow      Pain Score 8     Pain Loc      Pain Edu?      Excl. in GC?     Updated Vital Signs: BP (!) 170/102 (BP Location: Left Arm)   Pulse 66   Temp 97.6 F (36.4 C) (Oral)   Resp 20   Ht 5\' 7"  (1.702 m)   Wt 92.1 kg   LMP 06/30/2022 (Within Days)   SpO2 100%   Breastfeeding No   BMI 31.79 kg/m    General: Awake,  mild distress.  CV:  RRR.  Good peripheral perfusion.  Resp:  Normal effort.  CTA B. Abd:  Mild epigastric tenderness to palpation without rebound or guarding.  No distention.  Other:  No truncal vesicles.   ED Results / Procedures / Treatments  Labs (all labs ordered are listed, but only abnormal results are displayed) Labs Reviewed  LIPASE, BLOOD - Abnormal; Notable for the following components:      Result Value   Lipase 59 (*)    All other components within normal limits  COMPREHENSIVE METABOLIC PANEL - Abnormal; Notable for the following components:   Glucose, Bld 102 (*)    Total Protein 8.3 (*)    All other components within normal limits  URINALYSIS, ROUTINE W REFLEX MICROSCOPIC - Abnormal; Notable for the following components:   Color, Urine YELLOW (*)    APPearance HAZY (*)    Leukocytes,Ua LARGE (*)    Bacteria, UA RARE (*)    All other components within normal limits  CBC  PREGNANCY, URINE     EKG  ED ECG REPORT I,  Eilyn Polack J, the attending physician, personally viewed and interpreted this ECG.   Date: 07/07/2022  EKG Time: 0218  Rate: 76  Rhythm: normal sinus rhythm  Axis: Normal  Intervals:none  ST&T Change: Nonspecific    RADIOLOGY I have independently visualized and interpreted patient's ultrasound as well as noted the radiology interpretation:  RUQ ultrasound: Cholelithiasis with impacted stones within gallbladder neck; no cholecystitis  Official radiology report(s): US ABDOMEN LIMITED RUQ (LIVER/GB)  Result Date: 07/07/2022 CLINICAL DATA:  Epigastric abdominal pain EXAM: ULTRASOUND ABDOMEN LIMITED RIGHT UPPER QUADRANT COMPARISON:  None Available. FINDINGS: Gallbladder: Multiple gallstones measuring up to 11 mm in diameter seen impacted within the gallbladder neck. The gallbladder, however, is not distended, there is no gallbladder wall thickening, and no pericholecystic fluid is identified. The sonographic Percell Miller sign is reportedly negative. Common bile duct: Diameter: 3 mm in proximal diameter Liver: No focal lesion identified. Within normal limits in parenchymal echogenicity. Portal vein is patent on color Doppler imaging with normal direction of blood flow towards the liver. Other: None. IMPRESSION: 1. Cholelithiasis with multiple impacted stones seen within the gallbladder neck. No superimposed sonographic evidence of acute cholecystitis. Electronically Signed   By: Fidela Salisbury M.D.   On: 07/07/2022 03:39     PROCEDURES:  Critical Care performed: No  Procedures   MEDICATIONS ORDERED IN ED: Medications  sodium chloride 0.9 % bolus 1,000 mL (1,000 mLs Intravenous New Bag/Given 07/07/22 0248)  ondansetron (ZOFRAN) injection 4 mg (4 mg Intravenous Given 07/07/22 0247)  famotidine (PEPCID) IVPB 20 mg premix (0 mg Intravenous Stopped 07/07/22 0350)  ketorolac (TORADOL) 30 MG/ML injection 15 mg (15 mg Intravenous Given 07/07/22 0247)     IMPRESSION / MDM / ASSESSMENT AND PLAN / ED  COURSE  I reviewed the triage vital signs and the nursing notes.                             26 year old female presenting with upper abdominal pain and 1 episode of emesis. Differential diagnosis includes, but is not limited to, biliary disease (biliary colic, acute cholecystitis, cholangitis, choledocholithiasis, etc), intrathoracic causes for epigastric abdominal pain including ACS, gastritis, duodenitis, pancreatitis, small bowel or large bowel obstruction, abdominal aortic aneurysm, hernia, and ulcer(s).  I have personally reviewed patient's records and note a [facility] ED visit on 02/19/2022 for similar symptoms.  Patient's presentation is  most consistent with acute presentation with potential threat to life or bodily function.  We will obtain lab work to include LFTs/lipase, right upper quadrant abdominal ultrasound to evaluate for cholecystitis.  Administer IV Pepcid and Toradol for pain, IV Zofran for nausea and reassess.  Clinical Course as of 07/07/22 0352  Wed Jul 07, 2022  0350 Pain improved.  Updated patient on all laboratory and imaging results.  Will place on Keflex for UTI.  Discharge home with as needed Percocet and Zofran and close surgical follow-up with general surgery for cholelithiasis.  Strict return precautions given.  Patient verbalizes understanding agrees with plan of care. [JS]    Clinical Course User Index [JS] Paulette Blanch, MD     FINAL CLINICAL IMPRESSION(S) / ED DIAGNOSES   Final diagnoses:  Pain of upper abdomen  Lower urinary tract infectious disease  Biliary colic  Calculus of gallbladder without cholecystitis without obstruction     Rx / DC Orders   ED Discharge Orders     None        Note:  This document was prepared using Dragon voice recognition software and may include unintentional dictation errors.   Paulette Blanch, MD 07/07/22 [phone]

## 2022-07-07 NOTE — ED Triage Notes (Signed)
Pt arrived via POV with reports upper abd pain radiating to back, pt states she was seen at [facility] earlier this year with suspected problems with gall bladder.  Pt took ibuprofen with no relief.  Pt reports vomiting x1 as well.

## 2022-07-07 NOTE — H&P (View-Only) (Signed)
Patient ID: Judith Mckinney, female   DOB: 08/20/1996, 26 y.o.   MRN: [id number]  HPI Judith Mckinney is a 26 y.o. female Dr. Beather Arbour.  She presented last night to the emergency room with upper abdominal pain that radiated to her left back.  Reports that the pain is intermittent moderate to severe intensity.  She did have also nausea and vomiting last night.  She did have a bacon biscuit for supper last night she was doing breakfast for dinner. Of note this is the fourth episode.  First episode occurred about a month ago and she went to the ER in [place].  No ultrasound was done at that time.  Since then she had another 3 episodes with the last one being last night/early this morning. She has been doing well and reports no abdominal pain.  She actually came to work.  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 Denies any fevers any chills no evidence of biliary obstruction. SHe did have an ultrasound that I personally reviewed showing evidence of gallstones without cholecystitis normal common bile duct.  CBC and LFTs were normal.  Did have a UTI. Denies any prior abdominal operations.  She is able to perform more than 4 METS of activity without any shortness of or chest pain. Father and grandmother with hx biliary colic responded well to cholecystectomy.   HPI  Past Medical History:  Diagnosis Date   Medical history non-contributory    Psoriasis     Past Surgical History:  Procedure Laterality Date   NO PAST SURGERIES      History reviewed. No pertinent family history.  Social History Social History   Tobacco Use   Smoking status: Former   Smokeless tobacco: Never  Scientific laboratory technician Use: Every day  Substance Use Topics   Alcohol use: No   Drug use: No    Allergies  Allergen Reactions   Benzalkonium Chloride Rash   Neomycin-Bacitracin Zn-Polymyx Rash    Current Outpatient Medications  Medication Sig Dispense Refill   calcium carbonate (TUMS - DOSED  IN MG ELEMENTAL CALCIUM) 500 MG chewable tablet Chew 1-2 tablets by mouth as needed for heartburn. Patient takes up to 5 times daily prn.     cephALEXin (KEFLEX) 500 MG capsule Take 1 capsule (500 mg total) by mouth 3 (three) times daily. 21 capsule 0   diphenhydrAMINE (BENADRYL) 2 % cream Apply 1 application topically 3 (three) times daily as needed for itching.     levonorgestrel (MIRENA, 52 MG,) 20 MCG/DAY IUD Mirena 20 mcg/24 hours (8 yrs) 52 mg intrauterine device  Take by intrauterine route.     ondansetron (ZOFRAN-ODT) 4 MG disintegrating tablet Take 1 tablet (4 mg total) by mouth every 8 (eight) hours as needed for nausea or vomiting. 30 tablet 0   oxyCODONE-acetaminophen (PERCOCET/ROXICET) 5-325 MG tablet Take 1 tablet by mouth every 4 (four) hours as needed for severe pain 30 tablet 0   No current facility-administered medications for this visit.     Review of Systems Full ROS  was asked and was negative except for the information on the HPI  Physical Exam Blood pressure 116/80, pulse 63, temperature 98.6 F (37 C), temperature source Oral, height 5\' 7"  (1.702 m), weight 205 lb 9.6 oz (93.3 kg), last menstrual period 06/30/2022, SpO2 100 %. CONSTITUTIONAL: NAD BMI 32.2. EYES: Pupils are equal, round, a Sclera are non-icteric. EARS, NOSE, MOUTH AND THROAT: The oral mucosa is pink  and moist. Hearing is intact to voice. LYMPH NODES:  Lymph nodes in the neck are normal. RESPIRATORY:  Lungs are clear. There is normal respiratory effort, with equal breath sounds bilaterally, and without pathologic use of accessory muscles. CARDIOVASCULAR: Heart is regular without murmurs, gallops, or rubs. GI: The abdomen is  soft, nontender, and nondistended. There are no palpable masses. There is no hepatosplenomegaly. There are normal bowel sounds .  Negative Murphy sign GU: Rectal deferred.   MUSCULOSKELETAL: Normal muscle strength and tone. No cyanosis or edema.   SKIN: Turgor is good and there  are no pathologic skin lesions or ulcers. NEUROLOGIC: Motor and sensation is grossly normal. Cranial nerves are grossly intact. PSYCH:  Oriented to person, place and time. Affect is normal.  Data Reviewed  I have personally reviewed the patient's imaging, laboratory findings and medical records.    Assessment/Plan Symptomatic cholelithiasis recurrent vs Chronic cholecystitis.  Gust with the patient detail about her disease process. I do recommend cholecystectomy and she will be a good candidate for robotic approach. She wishes to have it done ASAP, next opening is next week. The risks, benefits, complications, treatment options, and expected outcomes were discussed with the patient. The possibilities of bleeding, recurrent infection, finding a normal gallbladder, perforation of viscus organs, damage to surrounding structures, bile leak, abscess formation, needing a drain placed, the need for additional procedures, reaction to medication, pulmonary aspiration,  failure to diagnose a condition, the possible need to convert to an open procedure, and creating a complication requiring transfusion or operation were discussed with the patient. The patient and/or family concurred with the proposed plan, giving informed consent.   A copy of this report was sent to the referring provider.  Sterling Big, MD FACS General Surgeon 07/07/2022, 2:39 PM

## 2022-07-08 ENCOUNTER — Telehealth: Payer: Self-pay

## 2022-07-08 NOTE — Telephone Encounter (Signed)
Patient has been advised of Pre-Admission date/time, and Surgery date at [facility].  Surgery Date: 07/13/22 Preadmission Testing Date: 07/12/22 (phone 8A-1P)  Patient has been made aware to call [phone], between 1-3:00pm the day before surgery, to find out what time to arrive for surgery.

## 2022-07-12 ENCOUNTER — Encounter
Admission: RE | Admit: 2022-07-12 | Discharge: 2022-07-12 | Disposition: A | Discharge status: Home / Self Care | Payer: 59 | Source: Ambulatory Visit | Attending: Surgery | Admitting: Surgery

## 2022-07-12 DIAGNOSIS — Z01818 Encounter for other preprocedural examination: Secondary | ICD-10-CM

## 2022-07-12 HISTORY — DX: Gastro-esophageal reflux disease without esophagitis: K21.9

## 2022-07-12 HISTORY — DX: Calculus of bile duct without cholangitis or cholecystitis without obstruction: K80.50

## 2022-07-12 HISTORY — DX: Unspecified pre-eclampsia, unspecified trimester: O14.90

## 2022-07-12 MED ORDER — ACETAMINOPHEN 500 MG PO TABS: 1000.0000 mg | ORAL_TABLET | ORAL | Status: AC

## 2022-07-12 MED ORDER — CELECOXIB 200 MG PO CAPS: 200.0000 mg | ORAL_CAPSULE | ORAL | Status: AC

## 2022-07-12 MED ORDER — ORAL CARE MOUTH RINSE: 15.0000 mL | Freq: Once | OROMUCOSAL | Status: AC

## 2022-07-12 MED ORDER — LACTATED RINGERS IV SOLN: INTRAVENOUS | Status: DC

## 2022-07-12 MED ORDER — FAMOTIDINE 20 MG PO TABS: 20.0000 mg | ORAL_TABLET | Freq: Once | ORAL | Status: AC

## 2022-07-12 MED ORDER — CEFAZOLIN SODIUM-DEXTROSE 2-4 GM/100ML-% IV SOLN
2.0000 g | INTRAVENOUS | Status: AC
  Administered 2022-07-13: 2 g via INTRAVENOUS

## 2022-07-12 MED ORDER — CHLORHEXIDINE GLUCONATE CLOTH 2 % EX PADS: 6.0000 | MEDICATED_PAD | Freq: Once | CUTANEOUS | Status: DC

## 2022-07-12 MED ORDER — CHLORHEXIDINE GLUCONATE CLOTH 2 % EX PADS
6.0000 | MEDICATED_PAD | Freq: Once | CUTANEOUS | Status: AC
  Administered 2022-07-13: 6 via TOPICAL

## 2022-07-12 MED ORDER — INDOCYANINE GREEN 25 MG IV SOLR
2.5000 mg | Freq: Once | INTRAVENOUS | Status: AC
  Administered 2022-07-13: 2.5 mg via INTRAVENOUS
  Filled 2022-07-12: qty 1

## 2022-07-12 MED ORDER — CHLORHEXIDINE GLUCONATE 0.12 % MT SOLN: 15.0000 mL | Freq: Once | OROMUCOSAL | Status: AC

## 2022-07-12 MED ORDER — GABAPENTIN 300 MG PO CAPS: 300.0000 mg | ORAL_CAPSULE | ORAL | Status: AC

## 2022-07-12 NOTE — Patient Instructions (Signed)
Your procedure is scheduled on:07-13-22 Tuesday Report to the Registration Desk on the 1st floor of the Medical Mall.Then proceed to the 2nd floor Surgery Desk To find out your arrival time, please call [phone] between 1PM - 3PM on:07-12-22 Monday If your arrival time is 6:00 am, do not arrive prior to that time as the Medical Mall entrance doors do not open until 6:00 am.  REMEMBER: Instructions that are not followed completely may result in serious medical risk, up to and including death; or upon the discretion of your surgeon and anesthesiologist your surgery may need to be rescheduled.  Do not eat food OR drink any liquids after midnight the night before surgery.  No gum chewing, lozengers or hard candies.  TAKE THESE MEDICATIONS THE MORNING OF SURGERY WITH A SIP OF WATER: -You may take oxyCODONE-acetaminophen (PERCOCET/ROXICET) for pain if needed -You may take ondansetron (ZOFRAN-ODT) for nausea if needed  One week prior to surgery: Stop Anti-inflammatories (NSAIDS) such as Advil, Aleve, Ibuprofen, Motrin, Naproxen, Naprosyn and Aspirin based products such as Excedrin, Goodys Powder, BC Powder. You may however, continue to take Tylenol/Percocet if needed for pain up until the day of surgery.  No Alcohol for 24 hours before or after surgery.  No Smoking including e-cigarettes for 24 hours prior to surgery.  No chewable tobacco products for at least 6 hours prior to surgery.  No nicotine patches on the day of surgery.  Do not use any "recreational" drugs for at least a week prior to your surgery.  Please be advised that the combination of cocaine and anesthesia may have negative outcomes, up to and including death. If you test positive for cocaine, your surgery will be cancelled.  On the morning of surgery brush your teeth with toothpaste and water, you may rinse your mouth with mouthwash if you wish. Do not swallow any toothpaste or mouthwash.  Use CHG Soap as directed on  instruction sheet.  Do not wear jewelry, make-up, hairpins, clips or nail polish.  Do not wear lotions, powders, or perfumes.   Do not shave body from the neck down 48 hours prior to surgery just in case you cut yourself which could leave a site for infection.  Also, freshly shaved skin may become irritated if using the CHG soap.  Contact lenses, hearing aids and dentures may not be worn into surgery.  Do not bring valuables to the hospital. [facility] is not responsible for any missing/lost belongings or valuables.   Notify your doctor if there is any change in your medical condition (cold, fever, infection).  Wear comfortable clothing (specific to your surgery type) to the hospital.  After surgery, you can help prevent lung complications by doing breathing exercises.  Take deep breaths and cough every 1-2 hours. Your doctor may order a device called an Incentive Spirometer to help you take deep breaths. When coughing or sneezing, hold a pillow firmly against your incision with both hands. This is called "splinting." Doing this helps protect your incision. It also decreases belly discomfort.  If you are being admitted to the hospital overnight, leave your suitcase in the car. After surgery it may be brought to your room.  If you are being discharged the day of surgery, you will not be allowed to drive home. You will need a responsible adult (18 years or older) to drive you home and stay with you that night.   If you are taking public transportation, you will need to have a responsible adult (18  years or older) with you. Please confirm with your physician that it is acceptable to use public transportation.   Please call the Pre-admissions Testing Dept. at [phone] if you have any questions about these instructions.  Surgery Visitation Policy:  Patients undergoing a surgery or procedure may have two family members or support persons with them as long as the person is not  COVID-19 positive or experiencing its symptoms.   MASKING: Due to an increase in RSV rates and hospitalizations, starting Wednesday, Nov. 15, in patient care areas in which we serve newborns, infants and children, masks will be required for teammates and visitors.  Children ages 72 and under may not visit. This policy affects the following departments only:  [facility] Postpartum area Mother Baby Unit Newborn nursery/Special care nursery  Other areas: Masks continue to be strongly recommended for patient-facing teammates, visitors and patients in all other areas. Visitation is not restricted outside of the units listed above.

## 2022-07-13 ENCOUNTER — Other Ambulatory Visit: Payer: Self-pay

## 2022-07-13 ENCOUNTER — Ambulatory Visit: Payer: 59 | Admitting: Anesthesiology

## 2022-07-13 ENCOUNTER — Ambulatory Visit
Admission: RE | Admit: 2022-07-13 | Discharge: 2022-07-13 | Disposition: A | Discharge status: Home / Self Care | Payer: 59 | Attending: Surgery | Admitting: Surgery

## 2022-07-13 ENCOUNTER — Encounter: Payer: Self-pay | Admitting: Surgery

## 2022-07-13 ENCOUNTER — Encounter
Admission: RE | Disposition: A | Discharge status: Home / Self Care | Payer: Self-pay | Source: Home / Self Care | Attending: Surgery

## 2022-07-13 DIAGNOSIS — K8044 Calculus of bile duct with chronic cholecystitis without obstruction: Secondary | ICD-10-CM | POA: Diagnosis not present

## 2022-07-13 DIAGNOSIS — K219 Gastro-esophageal reflux disease without esophagitis: Secondary | ICD-10-CM | POA: Insufficient documentation

## 2022-07-13 DIAGNOSIS — Z01818 Encounter for other preprocedural examination: Secondary | ICD-10-CM

## 2022-07-13 DIAGNOSIS — K8010 Calculus of gallbladder with chronic cholecystitis without obstruction: Secondary | ICD-10-CM

## 2022-07-13 DIAGNOSIS — Z87891 Personal history of nicotine dependence: Secondary | ICD-10-CM | POA: Insufficient documentation

## 2022-07-13 DIAGNOSIS — I10 Essential (primary) hypertension: Secondary | ICD-10-CM | POA: Diagnosis not present

## 2022-07-13 DIAGNOSIS — K8020 Calculus of gallbladder without cholecystitis without obstruction: Secondary | ICD-10-CM

## 2022-07-13 DIAGNOSIS — K8050 Calculus of bile duct without cholangitis or cholecystitis without obstruction: Secondary | ICD-10-CM | POA: Diagnosis present

## 2022-07-13 LAB — POCT PREGNANCY, URINE: Preg Test, Ur: NEGATIVE

## 2022-07-13 SURGERY — CHOLECYSTECTOMY, ROBOT-ASSISTED, LAPAROSCOPIC
Anesthesia: General

## 2022-07-13 MED ORDER — CELECOXIB 200 MG PO CAPS
ORAL_CAPSULE | ORAL | Status: AC
  Administered 2022-07-13: 200 mg via ORAL
  Filled 2022-07-13: qty 1

## 2022-07-13 MED ORDER — SUGAMMADEX SODIUM 200 MG/2ML IV SOLN
INTRAVENOUS | Status: DC | PRN
  Administered 2022-07-13 (×2): 100 mg via INTRAVENOUS

## 2022-07-13 MED ORDER — DROPERIDOL 2.5 MG/ML IJ SOLN
INTRAMUSCULAR | Status: AC
  Filled 2022-07-13: qty 2

## 2022-07-13 MED ORDER — LIDOCAINE HCL (PF) 2 % IJ SOLN
INTRAMUSCULAR | Status: AC
  Filled 2022-07-13: qty 5

## 2022-07-13 MED ORDER — MIDAZOLAM HCL 2 MG/2ML IJ SOLN
INTRAMUSCULAR | Status: AC
  Filled 2022-07-13: qty 2

## 2022-07-13 MED ORDER — HYDROCODONE-ACETAMINOPHEN 5-325 MG PO TABS: 1.0000 | ORAL_TABLET | ORAL | 0 refills | Status: DC | PRN

## 2022-07-13 MED ORDER — DEXAMETHASONE SODIUM PHOSPHATE 10 MG/ML IJ SOLN
INTRAMUSCULAR | Status: AC
  Filled 2022-07-13: qty 1

## 2022-07-13 MED ORDER — ONDANSETRON HCL 4 MG/2ML IJ SOLN: 4.0000 mg | Freq: Once | INTRAMUSCULAR | Status: DC | PRN

## 2022-07-13 MED ORDER — BUPIVACAINE LIPOSOME 1.3 % IJ SUSP
INTRAMUSCULAR | Status: DC | PRN
  Administered 2022-07-13: 20 mL

## 2022-07-13 MED ORDER — FENTANYL CITRATE (PF) 100 MCG/2ML IJ SOLN
INTRAMUSCULAR | Status: DC | PRN
  Administered 2022-07-13 (×2): 50 ug via INTRAVENOUS

## 2022-07-13 MED ORDER — HYDROCODONE-ACETAMINOPHEN 5-325 MG PO TABS
ORAL_TABLET | ORAL | Status: AC
  Filled 2022-07-13: qty 1

## 2022-07-13 MED ORDER — MEPERIDINE HCL 25 MG/ML IJ SOLN: 6.2500 mg | INTRAMUSCULAR | Status: DC | PRN

## 2022-07-13 MED ORDER — BUPIVACAINE-EPINEPHRINE (PF) 0.25% -1:200000 IJ SOLN
INTRAMUSCULAR | Status: DC | PRN
  Administered 2022-07-13: 30 mL

## 2022-07-13 MED ORDER — ACETAMINOPHEN 325 MG PO TABS: 325.0000 mg | ORAL_TABLET | ORAL | Status: DC | PRN

## 2022-07-13 MED ORDER — ROCURONIUM BROMIDE 10 MG/ML (PF) SYRINGE
PREFILLED_SYRINGE | INTRAVENOUS | Status: AC
  Filled 2022-07-13: qty 10

## 2022-07-13 MED ORDER — ACETAMINOPHEN 160 MG/5ML PO SOLN: 325.0000 mg | ORAL | Status: DC | PRN

## 2022-07-13 MED ORDER — PROPOFOL 10 MG/ML IV BOLUS
INTRAVENOUS | Status: DC | PRN
  Administered 2022-07-13: 170 mg via INTRAVENOUS

## 2022-07-13 MED ORDER — DROPERIDOL 2.5 MG/ML IJ SOLN
0.6250 mg | Freq: Once | INTRAMUSCULAR | Status: AC | PRN
  Administered 2022-07-13: 0.625 mg via INTRAVENOUS

## 2022-07-13 MED ORDER — HYDROCODONE-ACETAMINOPHEN 7.5-325 MG PO TABS
ORAL_TABLET | ORAL | Status: AC
  Filled 2022-07-13: qty 1

## 2022-07-13 MED ORDER — PROPOFOL 10 MG/ML IV BOLUS
INTRAVENOUS | Status: AC
  Filled 2022-07-13: qty 40

## 2022-07-13 MED ORDER — ONDANSETRON HCL 4 MG/2ML IJ SOLN
INTRAMUSCULAR | Status: AC
  Filled 2022-07-13: qty 2

## 2022-07-13 MED ORDER — LIDOCAINE HCL (CARDIAC) PF 100 MG/5ML IV SOSY
PREFILLED_SYRINGE | INTRAVENOUS | Status: DC | PRN
  Administered 2022-07-13: 100 mg via INTRAVENOUS

## 2022-07-13 MED ORDER — GABAPENTIN 300 MG PO CAPS
ORAL_CAPSULE | ORAL | Status: AC
  Administered 2022-07-13: 300 mg via ORAL
  Filled 2022-07-13: qty 1

## 2022-07-13 MED ORDER — BUPIVACAINE-EPINEPHRINE (PF) 0.25% -1:200000 IJ SOLN
INTRAMUSCULAR | Status: AC
  Filled 2022-07-13: qty 30

## 2022-07-13 MED ORDER — FAMOTIDINE 20 MG PO TABS
ORAL_TABLET | ORAL | Status: AC
  Administered 2022-07-13: 20 mg via ORAL
  Filled 2022-07-13: qty 1

## 2022-07-13 MED ORDER — DEXAMETHASONE SODIUM PHOSPHATE 10 MG/ML IJ SOLN
INTRAMUSCULAR | Status: DC | PRN
  Administered 2022-07-13: 10 mg via INTRAVENOUS

## 2022-07-13 MED ORDER — DEXMEDETOMIDINE HCL IN NACL 80 MCG/20ML IV SOLN
INTRAVENOUS | Status: DC | PRN
  Administered 2022-07-13: 4 ug via BUCCAL
  Administered 2022-07-13 (×4): 8 ug via BUCCAL

## 2022-07-13 MED ORDER — FENTANYL CITRATE (PF) 100 MCG/2ML IJ SOLN
INTRAMUSCULAR | Status: AC
  Filled 2022-07-13: qty 2

## 2022-07-13 MED ORDER — HYDROCODONE-ACETAMINOPHEN 5-325 MG PO TABS
1.0000 | ORAL_TABLET | ORAL | 0 refills | Status: DC | PRN
  Filled 2022-07-13: qty 20, 2d supply, fill #0

## 2022-07-13 MED ORDER — MIDAZOLAM HCL 2 MG/2ML IJ SOLN
INTRAMUSCULAR | Status: DC | PRN
  Administered 2022-07-13 (×2): 1 mg via INTRAVENOUS

## 2022-07-13 MED ORDER — HYDROCODONE-ACETAMINOPHEN 7.5-325 MG PO TABS
1.0000 | ORAL_TABLET | Freq: Once | ORAL | Status: AC | PRN
  Administered 2022-07-13: 1 via ORAL
  Filled 2022-07-13: qty 1

## 2022-07-13 MED ORDER — BUPIVACAINE LIPOSOME 1.3 % IJ SUSP
INTRAMUSCULAR | Status: AC
  Filled 2022-07-13: qty 20

## 2022-07-13 MED ORDER — HYDROMORPHONE HCL 1 MG/ML IJ SOLN
INTRAMUSCULAR | Status: DC | PRN
  Administered 2022-07-13 (×2): .5 mg via INTRAVENOUS

## 2022-07-13 MED ORDER — CEFAZOLIN SODIUM-DEXTROSE 2-4 GM/100ML-% IV SOLN
INTRAVENOUS | Status: AC
  Filled 2022-07-13: qty 100

## 2022-07-13 MED ORDER — ONDANSETRON HCL 4 MG/2ML IJ SOLN
INTRAMUSCULAR | Status: DC | PRN
  Administered 2022-07-13: 4 mg via INTRAVENOUS

## 2022-07-13 MED ORDER — STERILE WATER FOR IRRIGATION IR SOLN
Status: DC | PRN
  Administered 2022-07-13: 1000 mL

## 2022-07-13 MED ORDER — CHLORHEXIDINE GLUCONATE 0.12 % MT SOLN
OROMUCOSAL | Status: AC
  Administered 2022-07-13: 15 mL via OROMUCOSAL
  Filled 2022-07-13: qty 15

## 2022-07-13 MED ORDER — HYDROMORPHONE HCL 1 MG/ML IJ SOLN
INTRAMUSCULAR | Status: AC
  Filled 2022-07-13: qty 1

## 2022-07-13 MED ORDER — FENTANYL CITRATE (PF) 100 MCG/2ML IJ SOLN
25.0000 ug | INTRAMUSCULAR | Status: DC | PRN
  Administered 2022-07-13 (×4): 25 ug via INTRAVENOUS

## 2022-07-13 MED ORDER — ACETAMINOPHEN 500 MG PO TABS
ORAL_TABLET | ORAL | Status: AC
  Administered 2022-07-13: 1000 mg via ORAL
  Filled 2022-07-13: qty 2

## 2022-07-13 MED ORDER — ROCURONIUM BROMIDE 100 MG/10ML IV SOLN
INTRAVENOUS | Status: DC | PRN
  Administered 2022-07-13: 10 mg via INTRAVENOUS
  Administered 2022-07-13: 50 mg via INTRAVENOUS

## 2022-07-13 MED ORDER — OXYCODONE HCL 5 MG PO TABS
ORAL_TABLET | ORAL | Status: AC
  Filled 2022-07-13: qty 1

## 2022-07-13 SURGICAL SUPPLY — 50 items
ADH SKN CLS APL DERMABOND .7 (GAUZE/BANDAGES/DRESSINGS) ×1
CANNULA REDUC XI 12-8 STAPL (CANNULA) ×1
CANNULA REDUCER 12-8 DVNC XI (CANNULA) ×1 IMPLANT
CATH REDDICK CHOLANGI 4FR 50CM (CATHETERS) IMPLANT
CLIP LIGATING HEMO O LOK GREEN (MISCELLANEOUS) ×1 IMPLANT
DERMABOND ADVANCED .7 DNX12 (GAUZE/BANDAGES/DRESSINGS) ×1 IMPLANT
DRAPE ARM DVNC X/XI (DISPOSABLE) ×4 IMPLANT
DRAPE COLUMN DVNC XI (DISPOSABLE) ×1 IMPLANT
DRAPE DA VINCI XI ARM (DISPOSABLE) ×4
DRAPE DA VINCI XI COLUMN (DISPOSABLE) ×1
ELECT CAUTERY BLADE 6.4 (BLADE) ×1 IMPLANT
ELECT REM PT RETURN 9FT ADLT (ELECTROSURGICAL) ×1
ELECTRODE REM PT RTRN 9FT ADLT (ELECTROSURGICAL) ×1 IMPLANT
GLOVE BIO SURGEON STRL SZ7 (GLOVE) ×2 IMPLANT
GOWN STRL REUS W/ TWL LRG LVL3 (GOWN DISPOSABLE) ×4 IMPLANT
GOWN STRL REUS W/TWL LRG LVL3 (GOWN DISPOSABLE) ×4
IRRIGATION STRYKERFLOW (MISCELLANEOUS) IMPLANT
IRRIGATOR STRYKERFLOW (MISCELLANEOUS) ×1
IV CATH ANGIO 12GX3 LT BLUE (NEEDLE) IMPLANT
KIT PINK PAD W/HEAD ARE REST (MISCELLANEOUS) ×1
KIT PINK PAD W/HEAD ARM REST (MISCELLANEOUS) ×1 IMPLANT
LABEL OR SOLS (LABEL) ×1 IMPLANT
MANIFOLD NEPTUNE II (INSTRUMENTS) ×1 IMPLANT
NEEDLE HYPO 22GX1.5 SAFETY (NEEDLE) ×1 IMPLANT
NS IRRIG 500ML POUR BTL (IV SOLUTION) ×1 IMPLANT
OBTURATOR OPTICAL STANDARD 8MM (TROCAR) ×1
OBTURATOR OPTICAL STND 8 DVNC (TROCAR) ×1
OBTURATOR OPTICALSTD 8 DVNC (TROCAR) ×1 IMPLANT
PACK LAP CHOLECYSTECTOMY (MISCELLANEOUS) ×1 IMPLANT
PENCIL SMOKE EVACUATOR (MISCELLANEOUS) ×1 IMPLANT
SEAL CANN UNIV 5-8 DVNC XI (MISCELLANEOUS) ×3 IMPLANT
SEAL XI 5MM-8MM UNIVERSAL (MISCELLANEOUS) ×3
SET TUBE SMOKE EVAC HIGH FLOW (TUBING) ×1 IMPLANT
SOLUTION ELECTROLUBE (MISCELLANEOUS) ×1 IMPLANT
SPIKE FLUID TRANSFER (MISCELLANEOUS) ×1 IMPLANT
SPONGE T-LAP 18X18 ~~LOC~~+RFID (SPONGE) ×1 IMPLANT
SPONGE T-LAP 4X18 ~~LOC~~+RFID (SPONGE) IMPLANT
STAPLER CANNULA SEAL DVNC XI (STAPLE) ×1 IMPLANT
STAPLER CANNULA SEAL XI (STAPLE) ×1
STOPCOCK 3 WAY MALE LL (IV SETS)
STOPCOCK 3WAY MALE LL (IV SETS) IMPLANT
SUT MNCRL AB 4-0 PS2 18 (SUTURE) ×1 IMPLANT
SUT VICRYL 0 UR6 27IN ABS (SUTURE) ×2 IMPLANT
SYR 20ML LL LF (SYRINGE) ×1 IMPLANT
SYR 30ML LL (SYRINGE) ×1 IMPLANT
SYS BAG RETRIEVAL 10MM (BASKET) ×1
SYSTEM BAG RETRIEVAL 10MM (BASKET) ×1 IMPLANT
TRAP FLUID SMOKE EVACUATOR (MISCELLANEOUS) ×1 IMPLANT
WATER STERILE IRR 3000ML UROMA (IV SOLUTION) IMPLANT
WATER STERILE IRR 500ML POUR (IV SOLUTION) ×1 IMPLANT

## 2022-07-13 NOTE — Discharge Instructions (Addendum)
Laparoscopic Cholecystectomy, Care After   These instructions give you information on caring for yourself after your procedure. Your doctor may also give you more specific instructions. Call your doctor if you have any problems or questions after your procedure.  HOME CARE  Change your bandages (dressings) as told by your doctor.  Keep the wound dry and clean. Wash the wound gently with soap and water. Pat the wound dry with a clean towel.  Do not take baths, swim, or use hot tubs for 2 weeks, or as told by your doctor.  Only take medicine as told by your doctor.  Eat a normal diet as told by your doctor.  Do not lift anything heavier than 20 pounds until your doctor says it is okay.  Do not play contact sports for 1 week, or as told by your doctor. GET HELP IF:  Your wound is red, puffy (swollen), or painful.  You have yellowish-white fluid (pus) coming from the wound.  You have fluid draining from the wound for more than 1 day.  You have a bad smell coming from the wound.  Your wound breaks open. GET HELP RIGHT AWAY IF:  You have trouble breathing.  You have chest pain.  You have a fever >101  You have pain in the shoulders (shoulder strap areas) that is getting worse.  You feel dizzy or pass out (faint).  You have severe belly (abdominal) pain.  You feel sick to your stomach (nauseous) or throw up (vomit) for more than 1 day.  AMBULATORY SURGERY  DISCHARGE INSTRUCTIONS   The drugs that you were given will stay in your system until tomorrow so for the next 24 hours you should not:  Drive an automobile Make any legal decisions Drink any alcoholic beverage   You may resume regular meals tomorrow.  Today it is better to start with liquids and gradually work up to solid foods.  You may eat anything you prefer, but it is better to start with liquids, then soup and crackers, and gradually work up to solid foods.   Please notify your doctor immediately if you have any unusual  bleeding, trouble breathing, redness and pain at the surgery site, drainage, fever, or pain not relieved by medication.    Additional Instructions:  Information for Discharge Teaching:    DO NOT REMOVE TEAL EXPAREL BRACELET FOR 4 DAYs (96 hrs)  07/17/2022 EXPAREL (bupivacaine liposome injectable suspension)   Your surgeon or anesthesiologist gave you EXPAREL(bupivacaine) to help control your pain after surgery.  EXPAREL is a local anesthetic that provides pain relief by numbing the tissue around the surgical site. EXPAREL is designed to release pain medication over time and can control pain for up to 72 hours. Depending on how you respond to EXPAREL, you may require less pain medication during your recovery.  Possible side effects: Temporary loss of sensation or ability to move in the area where bupivacaine was injected. Nausea, vomiting, constipation Rarely, numbness and tingling in your mouth or lips, lightheadedness, or anxiety may occur. Call your doctor right away if you think you may be experiencing any of these sensations, or if you have other questions regarding possible side effects.  Follow all other discharge instructions given to you by your surgeon or nurse. Eat a healthy diet and drink plenty of water or other fluids.  If you return to the hospital for any reason within 96 hours following the administration of EXPAREL, it is important for health care providers to know that  you have received this anesthetic. A teal colored band has been placed on your arm with the date, time and amount of EXPAREL you have received in order to alert and inform your health care providers. Please leave this armband in place for the full 96 hours following administration, and then you may remove the band.    Please contact your physician with any problems or Same Day Surgery at [phone], Monday through Friday 6 am to 4 pm, or [place] at [facility] number at [phone].

## 2022-07-13 NOTE — Anesthesia Preprocedure Evaluation (Signed)
Anesthesia Evaluation  Patient identified by MRN, date of birth, ID band Patient awake    Reviewed: Allergy & Precautions, NPO status , Patient's Chart, lab work & pertinent test results  Airway Mallampati: III  TM Distance: >3 FB Neck ROM: full    Dental  (+) Chipped   Pulmonary neg pulmonary ROS, Patient abstained from smoking., former smoker   Pulmonary exam normal        Cardiovascular hypertension, negative cardio ROS Normal cardiovascular exam     Neuro/Psych negative neurological ROS  negative psych ROS   GI/Hepatic Neg liver ROS,GERD  Medicated and Controlled,,  Endo/Other  negative endocrine ROS    Renal/GU      Musculoskeletal   Abdominal   Peds  Hematology negative hematology ROS (+)   Anesthesia Other Findings Past Medical History: No date: Biliary colic No date: GERD (gastroesophageal reflux disease) No date: Pre-eclampsia No date: Psoriasis 07/07/2022: UTI (urinary tract infection)     Comment:  currently on Keflex tid  Past Surgical History: No date: NO PAST SURGERIES  BMI    Body Mass Index: 32.20 kg/m      Reproductive/Obstetrics negative OB ROS                             Anesthesia Physical Anesthesia Plan  ASA: 2  Anesthesia Plan: General ETT   Post-op Pain Management:    Induction: Intravenous  PONV Risk Score and Plan: 4 or greater and Ondansetron, Dexamethasone, Midazolam, Treatment may vary due to age or medical condition and Diphenhydramine  Airway Management Planned: Oral ETT  Additional Equipment:   Intra-op Plan:   Post-operative Plan: Extubation in OR  Informed Consent: I have reviewed the patients History and Physical, chart, labs and discussed the procedure including the risks, benefits and alternatives for the proposed anesthesia with the patient or authorized representative who has indicated his/her understanding and acceptance.      Dental Advisory Given  Plan Discussed with: Anesthesiologist, CRNA and Surgeon  Anesthesia Plan Comments: (Patient consented for risks of anesthesia including but not limited to:  - adverse reactions to medications - damage to eyes, teeth, lips or other oral mucosa - nerve damage due to positioning  - sore throat or hoarseness - Damage to heart, brain, nerves, lungs, other parts of body or loss of life  Patient voiced understanding.)        Anesthesia Quick Evaluation

## 2022-07-13 NOTE — Transfer of Care (Signed)
Immediate Anesthesia Transfer of Care Note  Patient: Judith Mckinney  Procedure(s) Performed: XI ROBOTIC ASSISTED LAPAROSCOPIC CHOLECYSTECTOMY INDOCYANINE GREEN FLUORESCENCE IMAGING (ICG)  Patient Location: PACU  Anesthesia Type:General  Level of Consciousness: awake and patient cooperative  Airway & Oxygen Therapy: Patient Spontanous Breathing and Patient connected to face mask oxygen  Post-op Assessment: Report given to RN and Post -op Vital signs reviewed and stable  Post vital signs: Reviewed and stable  Last Vitals:  Vitals Value Taken Time  BP 151/64 07/13/22 0904  Temp    Pulse 80 07/13/22 0908  Resp 16 07/13/22 0908  SpO2 100 % 07/13/22 0908  Vitals shown include unvalidated device data.  Last Pain:  Vitals:   07/13/22 0640  TempSrc: Oral  PainSc: 0-No pain         Complications: No notable events documented.

## 2022-07-13 NOTE — Interval H&P Note (Signed)
History and Physical Interval Note:  07/13/2022 7:23 AM  Judith Mckinney  has presented today for surgery, with the diagnosis of Biliary Colic, K80.50.  The various methods of treatment have been discussed with the patient and family. After consideration of risks, benefits and other options for treatment, the patient has consented to  Procedure(s): XI ROBOTIC ASSISTED LAPAROSCOPIC CHOLECYSTECTOMY (N/A) INDOCYANINE GREEN FLUORESCENCE IMAGING (ICG) (N/A) as a surgical intervention.  The patient's history has been reviewed, patient examined, no change in status, stable for surgery.  I have reviewed the patient's chart and labs.  Questions were answered to the patient's satisfaction.     Thaila Bottoms F Reshanda Lewey

## 2022-07-13 NOTE — Op Note (Signed)
Robotic assisted laparoscopic Cholecystectomy  Pre-operative Diagnosis: biliary colic  Post-operative Diagnosis: same  Procedure:  Robotic assisted laparoscopic Cholecystectomy  Surgeon: Sterling Big, MD FACS  Anesthesia: Gen. with endotracheal tube  Findings: Chronic Cholecystitis   Estimated Blood Loss: 5cc       Specimens: Gallbladder           Complications: none   Procedure Details  The patient was seen again in the Holding Room. The benefits, complications, treatment options, and expected outcomes were discussed with the patient. The risks of bleeding, infection, recurrence of symptoms, failure to resolve symptoms, bile duct damage, bile duct leak, retained common bile duct stone, bowel injury, any of which could require further surgery and/or ERCP, stent, or papillotomy were reviewed with the patient. The likelihood of improving the patient's symptoms with return to their baseline status is good.  The patient and/or family concurred with the proposed plan, giving informed consent.  The patient was taken to Operating Room, identified  and the procedure verified as Laparoscopic Cholecystectomy.  A Time Out was held and the above information confirmed.  Prior to the induction of general anesthesia, antibiotic prophylaxis was administered. VTE prophylaxis was in place. General endotracheal anesthesia was then administered and tolerated well. After the induction, the abdomen was prepped with Chloraprep and draped in the sterile fashion. The patient was positioned in the supine position.  Cut down technique was used to enter the abdominal cavity and a Hasson trochar was placed after two vicryl stitches were anchored to the fascia. Pneumoperitoneum was then created with CO2 and tolerated well without any adverse changes in the patient's vital signs.  Three 8-mm ports were placed under direct vision. All skin incisions  were infiltrated with a local anesthetic agent before making the  incision and placing the trocars.   The patient was positioned  in reverse Trendelenburg, robot was brought to the surgical field and docked in the standard fashion.  We made sure all the instrumentation was kept indirect view at all times and that there were no collision between the arms. I scrubbed out and went to the console.  The gallbladder was identified, the fundus grasped and retracted cephalad. Adhesions were lysed bluntly. Chronic inflammation seen with some adhesions from inflammation.The infundibulum was grasped and retracted laterally, exposing the peritoneum overlying the triangle of Calot. This was then divided and exposed in a blunt fashion. An extended critical view of the cystic duct and cystic artery was obtained.  The cystic duct was clearly identified and bluntly dissected.   Artery and duct were double clipped and divided. Using ICG cholangiography we visualize the cystic duct and CBD, no evidence of bile injuries observed. The gallbladder was taken from the gallbladder fossa in a retrograde fashion with the electrocautery.  Hemostasis was achieved with the electrocautery. nspection of the right upper quadrant was performed. No bleeding, bile duct injury or leak, or bowel injury was noted. Robotic instruments and robotic arms were undocked in the standard fashion.  I scrubbed back in.  The gallbladder was removed and placed in an Endocatch bag.   Pneumoperitoneum was released.  The periumbilical port site was closed with interrumpted 0 Vicryl sutures. 4-0 subcuticular Monocryl was used to close the skin. Dermabond was  applied.  The patient was then extubated and brought to the recovery room in stable condition. Sponge, lap, and needle counts were correct at closure and at the conclusion of the case.  Caroleen Hamman, MD, FACS

## 2022-07-13 NOTE — Anesthesia Postprocedure Evaluation (Signed)
Anesthesia Post Note  Patient: Judith Mckinney  Procedure(s) Performed: XI ROBOTIC ASSISTED LAPAROSCOPIC CHOLECYSTECTOMY INDOCYANINE GREEN FLUORESCENCE IMAGING (ICG)  Patient location during evaluation: Phase II Anesthesia Type: General Level of consciousness: awake and alert Pain management: pain level controlled Vital Signs Assessment: post-procedure vital signs reviewed and stable Respiratory status: spontaneous breathing, nonlabored ventilation and respiratory function stable Cardiovascular status: blood pressure returned to baseline and stable Postop Assessment: no apparent nausea or vomiting Anesthetic complications: no   No notable events documented.   Last Vitals:  Vitals:   07/13/22 0945 07/13/22 1004  BP:  136/82  Pulse:  74  Resp:  16  Temp: (!) 36.1 C (!) 36.2 C  SpO2:  94%    Last Pain:  Vitals:   07/13/22 1004  TempSrc: Temporal  PainSc: 4                  Zaydrian Batta Garry Heater

## 2022-07-13 NOTE — Anesthesia Procedure Notes (Signed)
Procedure Name: Intubation Date/Time: 07/13/2022 7:35 AM  Performed by: Tammi Klippel, CRNAPre-anesthesia Checklist: Patient identified, Patient being monitored, Timeout performed, Emergency Drugs available and Suction available Patient Re-evaluated:Patient Re-evaluated prior to induction Oxygen Delivery Method: Circle system utilized Preoxygenation: Pre-oxygenation with 100% oxygen Induction Type: IV induction Ventilation: Mask ventilation without difficulty Laryngoscope Size: 3 and McGraph Grade View: Grade I Tube type: Oral Tube size: 7.0 mm Number of attempts: 1 Airway Equipment and Method: Stylet Placement Confirmation: ETT inserted through vocal cords under direct vision, positive ETCO2 and breath sounds checked- equal and bilateral Secured at: 21 cm Tube secured with: Tape Dental Injury: Teeth and Oropharynx as per pre-operative assessment

## 2022-07-14 LAB — SURGICAL PATHOLOGY

## 2022-08-02 ENCOUNTER — Ambulatory Visit (INDEPENDENT_AMBULATORY_CARE_PROVIDER_SITE_OTHER): Payer: 59 | Admitting: Surgery

## 2022-08-02 ENCOUNTER — Encounter: Payer: Self-pay | Admitting: Surgery

## 2022-08-02 VITALS — BP 119/73 | HR 91 | Temp 98.7°F | Ht 67.0 in | Wt 204.0 lb

## 2022-08-02 DIAGNOSIS — Z09 Encounter for follow-up examination after completed treatment for conditions other than malignant neoplasm: Secondary | ICD-10-CM

## 2022-08-02 DIAGNOSIS — K8010 Calculus of gallbladder with chronic cholecystitis without obstruction: Secondary | ICD-10-CM

## 2022-08-03 ENCOUNTER — Encounter: Payer: Self-pay | Admitting: Surgery

## 2022-08-03 NOTE — Progress Notes (Signed)
Judith Mckinney is 2 1/2 weeks out from robotic cholecystectomy.  She did very well.  No fevers no chills currently no pain.  Pathology discussed with her in detail.  She had some occasional loose bowel movements.  PE : NAD  Abd: Incisions healing well without evidence of infection or peritonitis.  A/P doing very well without complications. Lifting restrictions RTC as needed

## 2022-08-06 ENCOUNTER — Encounter: Payer: Self-pay | Admitting: Nurse Practitioner

## 2022-08-06 ENCOUNTER — Other Ambulatory Visit: Payer: Self-pay

## 2022-08-06 ENCOUNTER — Ambulatory Visit: Payer: 59 | Admitting: Nurse Practitioner

## 2022-08-06 VITALS — BP 124/82 | HR 98 | Temp 98.6°F | Resp 16 | Ht 66.5 in | Wt 201.3 lb

## 2022-08-06 DIAGNOSIS — Z7689 Persons encountering health services in other specified circumstances: Secondary | ICD-10-CM

## 2022-08-06 DIAGNOSIS — Z6832 Body mass index (BMI) 32.0-32.9, adult: Secondary | ICD-10-CM

## 2022-08-06 DIAGNOSIS — L409 Psoriasis, unspecified: Secondary | ICD-10-CM

## 2022-08-06 DIAGNOSIS — E6609 Other obesity due to excess calories: Secondary | ICD-10-CM | POA: Insufficient documentation

## 2022-08-06 DIAGNOSIS — G43109 Migraine with aura, not intractable, without status migrainosus: Secondary | ICD-10-CM | POA: Diagnosis not present

## 2022-08-06 MED ORDER — ZEPBOUND 2.5 MG/0.5ML ~~LOC~~ SOAJ
0.2500 mg | SUBCUTANEOUS | 0 refills | Status: DC
  Filled 2022-08-06: qty 2, fill #0

## 2022-08-06 MED ORDER — SUMATRIPTAN SUCCINATE 25 MG PO TABS
25.0000 mg | ORAL_TABLET | ORAL | 0 refills | Status: AC | PRN
  Filled 2022-08-06: qty 9, 15d supply, fill #0

## 2022-08-06 NOTE — Progress Notes (Signed)
BP 124/82   Pulse 98   Temp 98.6 F (37 C) (Oral)   Resp 16   Ht 5' 6.5" (1.689 m)   Wt 201 lb 4.8 oz (91.3 kg)   LMP 06/30/2022 (Within Days)   SpO2 100%   BMI 32.00 kg/m    Subjective:    Patient ID: Judith Mckinney, female    DOB: 04/22/1996, 26 y.o.   MRN: [id number]  HPI: Judith Mckinney is a 26 y.o. female  Chief Complaint  Patient presents with   Establish Care   Establish care:  her last physical was [facility] was a while ago , last pap 02/18/2020.  Medical history includes she did have HTN during pregnancy, migraines, psoriosis, pectus excavatum . Family history includes HTN, strokes, RA, MI, skin cancer, renal failure . Recent cholecystectomy.   Migraines:  she says they typically happen when the weather changes. She says that she does become light sensitive.  She says it typically happens on the right side.  She says she has had a couple auras.  She says that she takes some excedrin she says it is bearable but does not take it away.  She has never done migraine medication before.  Will trial imitrex.   Obesity:  her current weight is 201 lbs with a BMI of 32.  She says that she has been on saxenda.  She denies any family history of thyroid cancer and no personal history of pancreatitis. Discussed zepbound and side effects.  Will send in prescription.   Health maintenance: Labs recently done due to having surgery Pap is up to date  Relevant past medical, surgical, family and social history reviewed and updated as indicated. Interim medical history since our last visit reviewed. Allergies and medications reviewed and updated.  Review of Systems  Constitutional: Negative for fever or weight change.  Respiratory: Negative for cough and shortness of breath.   Cardiovascular: Negative for chest pain or palpitations.  Gastrointestinal: Negative for abdominal pain, no bowel changes.  Musculoskeletal: Negative for gait problem or joint swelling.  Skin: Negative for rash.   Neurological: Negative for dizziness or headache.  No other specific complaints in a complete review of systems (except as listed in HPI above).      Objective:    BP 124/82   Pulse 98   Temp 98.6 F (37 C) (Oral)   Resp 16   Ht 5' 6.5" (1.689 m)   Wt 201 lb 4.8 oz (91.3 kg)   LMP 06/30/2022 (Within Days)   SpO2 100%   BMI 32.00 kg/m   Wt Readings from Last 3 Encounters:  08/06/22 201 lb 4.8 oz (91.3 kg)  08/02/22 204 lb (92.5 kg)  07/13/22 205 lb 9.6 oz (93.3 kg)    Physical Exam  Constitutional: Patient appears well-developed and well-nourished. Obese  No distress.  HEENT: head atraumatic, normocephalic, pupils equal and reactive to light, neck supple Cardiovascular: Normal rate, regular rhythm and normal heart sounds.  No murmur heard. No BLE edema. Pulmonary/Chest: Effort normal and breath sounds normal. No respiratory distress. Abdominal: Soft.  There is no tenderness. Psychiatric: Patient has a normal mood and affect. behavior is normal. Judgment and thought content normal.   Results for orders placed or performed during the hospital encounter of 07/13/22  Pregnancy, urine POC  Result Value Ref Range   Preg Test, Ur NEGATIVE NEGATIVE  Surgical pathology  Result Value Ref Range   SURGICAL PATHOLOGY      SURGICAL PATHOLOGY CASE:  [phone] PATIENT: Loralye Dutkiewicz Surgical Pathology Report     Specimen Submitted: A. Gallbladder  Clinical History: Biliary colic, K80.50      DIAGNOSIS: A.  GALLBLADDER; CHOLECYSTECTOMY: - CHRONIC CHOLECYSTITIS. - CHOLELITHIASIS. - CHOLESTEROLOSIS.  GROSS DESCRIPTION: A. Labeled: Gallbladder Received: Fresh Collection time: 8:45 AM on 07/13/2022 Placed into formalin time: 9:50 AM on 07/13/2022 Size of specimen: 5.5 x 2.5 x 2.1 cm Specimen integrity: Previously disrupted External surface: Yellow to green and glistening with a roughened hepatic surface Wall thickness: 0.2 cm Mucosa: Tan and velvety with diffuse  cholesterolosis Cystic duct: The cystic duct is 1.0 x 0.7 x 0.2 cm and patent.  No adjacent lymph nodes are identified. Bile present: Yes, green and viscous Stones present: Yes, multiple yellow stones are present within the lumen, 1.7 x 1.2 x 0.7 cm in aggregate Other findings: None noted  Block summary: 1 -cys tic duct margin, en face and inked blue with representative wall  CM 07/13/2022  Final Diagnosis performed by Alcario Drought, MD.   Electronically signed 07/14/2022 9:36:29AM The electronic signature indicates that the named Attending Pathologist has evaluated the specimen Technical component performed at [facility], [address], [place], [place] [address] Lab: [phone] Dir: Jolene Schimke, MD, MMM  Professional component performed at [facility], [address] [place], [place], [place] [address] Lab: [phone] Dir: Beryle Quant, MD       Assessment & Plan:   Problem List Items Addressed This Visit       Cardiovascular and Mediastinum   Migraine with aura and without status migrainosus, not intractable    Trial Imitrex for onset of migraine      Relevant Medications   SUMAtriptan (IMITREX) 25 MG tablet     Musculoskeletal and Integument   Psoriasis    Reports she has not had any problems with it lately        Other   Class 1 obesity due to excess calories with serious comorbidity and body mass index (BMI) of 32.0 to 32.9 in adult - Primary    Was on saxenda. Discussed zepbound with patient, start zepbound      Relevant Medications   Tirzepatide-Weight Management (ZEPBOUND) 2.5 MG/0.5ML SOAJ   Other Visit Diagnoses     Encounter to establish care       schedule cpe when due        Follow up plan: Return in about 4 weeks (around 09/03/2022) for follow up after starting zepbound, then follow up in three months.

## 2022-08-06 NOTE — Assessment & Plan Note (Signed)
Reports she has not had any problems with it lately

## 2022-08-06 NOTE — Assessment & Plan Note (Signed)
Was on saxenda. Discussed zepbound with patient, start zepbound

## 2022-08-06 NOTE — Assessment & Plan Note (Signed)
Trial Imitrex for onset of migraine

## 2022-08-12 ENCOUNTER — Other Ambulatory Visit: Payer: Self-pay

## 2022-08-12 ENCOUNTER — Other Ambulatory Visit: Payer: Self-pay | Admitting: Nurse Practitioner

## 2022-08-12 ENCOUNTER — Encounter: Payer: Self-pay | Admitting: Nurse Practitioner

## 2022-08-12 DIAGNOSIS — Z20828 Contact with and (suspected) exposure to other viral communicable diseases: Secondary | ICD-10-CM

## 2022-08-12 MED ORDER — OSELTAMIVIR PHOSPHATE 75 MG PO CAPS
75.0000 mg | ORAL_CAPSULE | Freq: Two times a day (BID) | ORAL | 0 refills | Status: AC
  Filled 2022-08-12: qty 10, 5d supply, fill #0

## 2022-08-13 ENCOUNTER — Other Ambulatory Visit: Payer: Self-pay

## 2022-08-13 ENCOUNTER — Encounter: Payer: Self-pay | Admitting: Nurse Practitioner

## 2022-08-13 ENCOUNTER — Other Ambulatory Visit: Payer: Self-pay | Admitting: Nurse Practitioner

## 2022-08-13 DIAGNOSIS — E6609 Other obesity due to excess calories: Secondary | ICD-10-CM

## 2022-08-13 MED ORDER — SEMAGLUTIDE-WEIGHT MANAGEMENT 0.25 MG/0.5ML ~~LOC~~ SOAJ
0.2500 mg | SUBCUTANEOUS | 0 refills | Status: DC
  Filled 2022-08-13: qty 2, 28d supply, fill #0

## 2022-08-19 ENCOUNTER — Other Ambulatory Visit: Payer: Self-pay | Admitting: Nurse Practitioner

## 2022-08-19 ENCOUNTER — Other Ambulatory Visit: Payer: Self-pay

## 2022-08-19 DIAGNOSIS — E6609 Other obesity due to excess calories: Secondary | ICD-10-CM

## 2022-08-19 MED ORDER — ZEPBOUND 2.5 MG/0.5ML ~~LOC~~ SOAJ
2.5000 mg | SUBCUTANEOUS | 0 refills | Status: DC
  Filled 2022-08-19: qty 2, 28d supply, fill #0

## 2022-09-06 ENCOUNTER — Ambulatory Visit: Payer: Self-pay | Admitting: Nurse Practitioner

## 2022-09-08 ENCOUNTER — Ambulatory Visit (INDEPENDENT_AMBULATORY_CARE_PROVIDER_SITE_OTHER): Payer: 59 | Admitting: Nurse Practitioner

## 2022-09-08 ENCOUNTER — Other Ambulatory Visit: Payer: Self-pay

## 2022-09-08 ENCOUNTER — Encounter: Payer: Self-pay | Admitting: Nurse Practitioner

## 2022-09-08 VITALS — BP 122/84 | HR 82 | Temp 98.1°F | Resp 16 | Ht 66.5 in | Wt 197.7 lb

## 2022-09-08 DIAGNOSIS — Z6832 Body mass index (BMI) 32.0-32.9, adult: Secondary | ICD-10-CM | POA: Diagnosis not present

## 2022-09-08 DIAGNOSIS — E6609 Other obesity due to excess calories: Secondary | ICD-10-CM | POA: Diagnosis not present

## 2022-09-08 DIAGNOSIS — L709 Acne, unspecified: Secondary | ICD-10-CM | POA: Diagnosis not present

## 2022-09-08 MED ORDER — ZEPBOUND 5 MG/0.5ML ~~LOC~~ SOAJ
5.0000 mg | SUBCUTANEOUS | 0 refills | Status: DC
  Filled 2022-09-08: qty 2, 28d supply, fill #0

## 2022-09-08 MED ORDER — TRETINOIN 0.05 % EX CREA
1.0000 | TOPICAL_CREAM | Freq: Every day | CUTANEOUS | 3 refills | Status: DC
  Filled 2022-09-08: qty 45, 90d supply, fill #0

## 2022-09-08 MED ORDER — ZEPBOUND 7.5 MG/0.5ML ~~LOC~~ SOAJ
7.5000 mg | SUBCUTANEOUS | 0 refills | Status: DC
  Filled 2022-09-08 – 2022-10-04 (×2): qty 2, 28d supply, fill #0

## 2022-09-08 MED ORDER — ZEPBOUND 10 MG/0.5ML ~~LOC~~ SOAJ
10.0000 mg | SUBCUTANEOUS | 0 refills | Status: DC
  Filled 2022-09-08 – 2022-11-02 (×2): qty 2, 28d supply, fill #0

## 2022-09-08 NOTE — Progress Notes (Signed)
BP 122/84   Pulse 82   Temp 98.1 F (36.7 C) (Oral)   Resp 16   Ht 5' 6.5" (1.689 m)   Wt 197 lb 11.2 oz (89.7 kg)   SpO2 99%   BMI 31.43 kg/m    Subjective:    Patient ID: Judith Mckinney, female    DOB: Feb 08, 1996, 27 y.o.   MRN: [id number]  HPI: Judith Mckinney is a 27 y.o. female  Chief Complaint  Patient presents with   Obesity   Obesity:  her starting weight was 201 lbs today her weight is 197 lbs.  She started zepbound 2.5 mg weekly. She reports that she has been doing well. Will send in refills of zepbound.  Continue working on diet and exercise.  Follow up in three months.   Acne: she says that she does not get a period right now because she has an IUD, however she does get acne outbreak once a month. She would like to try retinoid a treatment.  Prescription sent in.  Patient reports she does have a skin routine.   Relevant past medical, surgical, family and social history reviewed and updated as indicated. Interim medical history since our last visit reviewed. Allergies and medications reviewed and updated.  Review of Systems  Constitutional: Negative for fever or weight change.  Respiratory: Negative for cough and shortness of breath.   Cardiovascular: Negative for chest pain or palpitations.  Gastrointestinal: Negative for abdominal pain, no bowel changes.  Musculoskeletal: Negative for gait problem or joint swelling.  Skin: Negative for rash.  Neurological: Negative for dizziness or headache.  No other specific complaints in a complete review of systems (except as listed in HPI above).      Objective:    BP 122/84   Pulse 82   Temp 98.1 F (36.7 C) (Oral)   Resp 16   Ht 5' 6.5" (1.689 m)   Wt 197 lb 11.2 oz (89.7 kg)   SpO2 99%   BMI 31.43 kg/m   Wt Readings from Last 3 Encounters:  09/08/22 197 lb 11.2 oz (89.7 kg)  08/06/22 201 lb 4.8 oz (91.3 kg)  08/02/22 204 lb (92.5 kg)    Physical Exam  Constitutional: Patient appears well-developed and  well-nourished. Obese  No distress.  HEENT: head atraumatic, normocephalic, pupils equal and reactive to light, neck supple Cardiovascular: Normal rate, regular rhythm and normal heart sounds.  No murmur heard. No BLE edema. Pulmonary/Chest: Effort normal and breath sounds normal. No respiratory distress. Abdominal: Soft.  There is no tenderness. Psychiatric: Patient has a normal mood and affect. behavior is normal. Judgment and thought content normal.  Results for orders placed or performed in visit on 08/06/22  HM PAP SMEAR  Result Value Ref Range   HM Pap smear normal       Assessment & Plan:   Problem List Items Addressed This Visit       Other   Class 1 obesity due to excess calories with serious comorbidity and body mass index (BMI) of 32.0 to 32.9 in adult - Primary    Continue zepbound, increase dose to 5 mg weekly, then 7 mg weekly,  then 10 mg weekly. Continue to work on life style modification.       Relevant Medications   tirzepatide (ZEPBOUND) 5 MG/0.5ML Pen   tirzepatide (ZEPBOUND) 7.5 MG/0.5ML Pen (Start on 10/04/2022)   tirzepatide (ZEPBOUND) 10 MG/0.5ML Pen (Start on 11/01/2022)   Other Visit Diagnoses  Acne, unspecified acne type       start retinoid a . continue skin care routine.   Relevant Medications   tretinoin (RETIN-A) 0.05 % cream        Follow up plan: Return in about 3 months (around 12/08/2022) for follow up.

## 2022-09-08 NOTE — Assessment & Plan Note (Signed)
Continue zepbound, increase dose to 5 mg weekly, then 7 mg weekly,  then 10 mg weekly. Continue to work on life style modification.

## 2022-09-09 ENCOUNTER — Other Ambulatory Visit: Payer: Self-pay | Admitting: Nurse Practitioner

## 2022-09-09 ENCOUNTER — Encounter: Payer: Self-pay | Admitting: Nurse Practitioner

## 2022-09-09 DIAGNOSIS — L709 Acne, unspecified: Secondary | ICD-10-CM

## 2022-09-09 MED ORDER — TRETINOIN 0.05 % EX CREA: 1.0000 | TOPICAL_CREAM | Freq: Every day | CUTANEOUS | 3 refills | Status: AC

## 2022-10-04 ENCOUNTER — Other Ambulatory Visit: Payer: Self-pay

## 2022-11-02 ENCOUNTER — Other Ambulatory Visit: Payer: Self-pay

## 2022-11-03 ENCOUNTER — Other Ambulatory Visit: Payer: Self-pay

## 2022-11-03 ENCOUNTER — Encounter: Payer: Self-pay | Admitting: Nurse Practitioner

## 2022-11-05 ENCOUNTER — Ambulatory Visit: Payer: Self-pay | Admitting: Nurse Practitioner

## 2022-11-19 ENCOUNTER — Other Ambulatory Visit: Payer: Self-pay

## 2022-11-19 ENCOUNTER — Ambulatory Visit: Payer: 59 | Admitting: Nurse Practitioner

## 2022-11-19 VITALS — BP 120/72 | HR 98 | Temp 97.9°F | Resp 16 | Ht 66.5 in | Wt 178.7 lb

## 2022-11-19 DIAGNOSIS — Z6832 Body mass index (BMI) 32.0-32.9, adult: Secondary | ICD-10-CM | POA: Diagnosis not present

## 2022-11-19 DIAGNOSIS — E6609 Other obesity due to excess calories: Secondary | ICD-10-CM | POA: Diagnosis not present

## 2022-11-19 MED ORDER — ZEPBOUND 15 MG/0.5ML ~~LOC~~ SOAJ
15.0000 mg | SUBCUTANEOUS | 5 refills | Status: DC
  Filled 2022-11-19: qty 2, 28d supply, fill #0

## 2022-11-19 MED ORDER — ZEPBOUND 12.5 MG/0.5ML ~~LOC~~ SOAJ
12.5000 mg | SUBCUTANEOUS | 0 refills | Status: DC
  Filled 2022-11-19: qty 2, 28d supply, fill #0

## 2022-11-19 NOTE — Assessment & Plan Note (Signed)
Doing well on zepbound, next two doses sent in. Continue lifestyle modification.

## 2022-11-19 NOTE — Progress Notes (Signed)
   BP 120/72   Pulse 98   Temp 97.9 F (36.6 C) (Oral)   Resp 16   Ht 5' 6.5" (1.689 m)   Wt 178 lb 11.2 oz (81.1 kg)   SpO2 99%   BMI 28.41 kg/m    Subjective:    Patient ID: Judith Mckinney, female    DOB: Jun 15, 1996, 27 y.o.   MRN: [id number]  HPI: Judith Mckinney is a 27 y.o. female  Chief Complaint  Patient presents with   Obesity    3 month follow up   Obesity:  her starting weight was 201 lbs, today her weight is 178 lbs.  She has been using zepbound.  Current dose is 10 mg weekly.  Patient reports she is doing well. She has been continuing to work on lifestyle modification. Including eating in a calorie deficit and physical activity.  She is doing well on current treatment.  Next two doses sent in. Medication will likely not be covered at the end of April.  Discussed plan and she will continue to work on lifestyle modification.    Relevant past medical, surgical, family and social history reviewed and updated as indicated. Interim medical history since our last visit reviewed. Allergies and medications reviewed and updated.  Review of Systems  Constitutional: Negative for fever or weight change.  Respiratory: Negative for cough and shortness of breath.   Cardiovascular: Negative for chest pain or palpitations.  Gastrointestinal: Negative for abdominal pain, no bowel changes.  Musculoskeletal: Negative for gait problem or joint swelling.  Skin: Negative for rash.  Neurological: Negative for dizziness or headache.  No other specific complaints in a complete review of systems (except as listed in HPI above).      Objective:    BP 120/72   Pulse 98   Temp 97.9 F (36.6 C) (Oral)   Resp 16   Ht 5' 6.5" (1.689 m)   Wt 178 lb 11.2 oz (81.1 kg)   SpO2 99%   BMI 28.41 kg/m   Wt Readings from Last 3 Encounters:  11/19/22 178 lb 11.2 oz (81.1 kg)  09/08/22 197 lb 11.2 oz (89.7 kg)  08/06/22 201 lb 4.8 oz (91.3 kg)    Physical Exam  Constitutional: Patient appears  well-developed and well-nourished. Obese  No distress.  HEENT: head atraumatic, normocephalic, pupils equal and reactive to light, neck supple Cardiovascular: Normal rate, regular rhythm and normal heart sounds.  No murmur heard. No BLE edema. Pulmonary/Chest: Effort normal and breath sounds normal. No respiratory distress. Abdominal: Soft.  There is no tenderness. Psychiatric: Patient has a normal mood and affect. behavior is normal. Judgment and thought content normal.  Results for orders placed or performed in visit on 08/06/22  HM PAP SMEAR  Result Value Ref Range   HM Pap smear normal       Assessment & Plan:   Problem List Items Addressed This Visit       Other   Class 1 obesity due to excess calories with serious comorbidity and body mass index (BMI) of 32.0 to 32.9 in adult - Primary    Doing well on zepbound, next two doses sent in. Continue lifestyle modification.       Relevant Medications   tirzepatide (ZEPBOUND) 12.5 MG/0.5ML Pen   tirzepatide (ZEPBOUND) 15 MG/0.5ML Pen     Follow up plan: Return in about 4 months (around 03/21/2023) for follow up.

## 2022-11-25 ENCOUNTER — Other Ambulatory Visit: Payer: Self-pay

## 2022-12-08 ENCOUNTER — Ambulatory Visit: Payer: 59 | Admitting: Nurse Practitioner

## 2023-02-03 ENCOUNTER — Encounter: Payer: Self-pay | Admitting: Nurse Practitioner

## 2023-02-03 ENCOUNTER — Other Ambulatory Visit: Payer: Self-pay

## 2023-02-03 ENCOUNTER — Other Ambulatory Visit: Payer: Self-pay | Admitting: Nurse Practitioner

## 2023-02-03 DIAGNOSIS — T753XXA Motion sickness, initial encounter: Secondary | ICD-10-CM

## 2023-02-03 MED ORDER — SCOPOLAMINE 1 MG/3DAYS TD PT72
1.0000 | MEDICATED_PATCH | TRANSDERMAL | 0 refills | Status: DC
  Filled 2023-02-03: qty 5, 15d supply, fill #0

## 2023-02-07 ENCOUNTER — Other Ambulatory Visit: Payer: Self-pay

## 2023-02-07 DIAGNOSIS — Z09 Encounter for follow-up examination after completed treatment for conditions other than malignant neoplasm: Secondary | ICD-10-CM

## 2023-02-07 DIAGNOSIS — Z5321 Procedure and treatment not carried out due to patient leaving prior to being seen by health care provider: Secondary | ICD-10-CM | POA: Diagnosis not present

## 2023-02-07 DIAGNOSIS — R112 Nausea with vomiting, unspecified: Secondary | ICD-10-CM

## 2023-02-07 DIAGNOSIS — R109 Unspecified abdominal pain: Secondary | ICD-10-CM | POA: Diagnosis not present

## 2023-02-07 DIAGNOSIS — R1011 Right upper quadrant pain: Secondary | ICD-10-CM

## 2023-02-08 ENCOUNTER — Other Ambulatory Visit
Admission: RE | Admit: 2023-02-08 | Discharge: 2023-02-08 | Disposition: A | Discharge status: Home / Self Care | Payer: 59 | Attending: Surgery | Admitting: Surgery

## 2023-02-08 DIAGNOSIS — R112 Nausea with vomiting, unspecified: Secondary | ICD-10-CM | POA: Insufficient documentation

## 2023-02-08 DIAGNOSIS — R1010 Upper abdominal pain, unspecified: Secondary | ICD-10-CM | POA: Diagnosis not present

## 2023-02-08 LAB — CBC
HCT: 38 % (ref 36.0–46.0)
Hemoglobin: 12.6 g/dL (ref 12.0–15.0)
MCH: 30.7 pg (ref 26.0–34.0)
MCHC: 33.2 g/dL (ref 30.0–36.0)
MCV: 92.5 fL (ref 80.0–100.0)
Platelets: 265 10*3/uL (ref 150–400)
RBC: 4.11 MIL/uL (ref 3.87–5.11)
RDW: 12.6 % (ref 11.5–15.5)
WBC: 5.9 10*3/uL (ref 4.0–10.5)
nRBC: 0 % (ref 0.0–0.2)

## 2023-02-08 LAB — COMPREHENSIVE METABOLIC PANEL
ALT: 306 U/L — ABNORMAL HIGH (ref 0–44)
AST: 222 U/L — ABNORMAL HIGH (ref 15–41)
Albumin: 3.9 g/dL (ref 3.5–5.0)
Alkaline Phosphatase: 124 U/L (ref 38–126)
Anion gap: 10 (ref 5–15)
BUN: 6 mg/dL (ref 6–20)
CO2: 26 mmol/L (ref 22–32)
Calcium: 8.7 mg/dL — ABNORMAL LOW (ref 8.9–10.3)
Chloride: 105 mmol/L (ref 98–111)
Creatinine, Ser: 0.59 mg/dL (ref 0.44–1.00)
GFR, Estimated: >60 mL/min (ref >60)
Glucose, Bld: 89 mg/dL (ref 70–99)
Potassium: 4 mmol/L (ref 3.5–5.1)
Sodium: 136 mmol/L (ref 135–145)
Total Bilirubin: 3.8 mg/dL — ABNORMAL HIGH (ref 0.3–1.2)
Total Protein: 7.1 g/dL (ref 6.5–8.1)

## 2023-02-09 ENCOUNTER — Encounter: Payer: Self-pay | Admitting: Surgery

## 2023-02-09 ENCOUNTER — Ambulatory Visit
Admission: RE | Admit: 2023-02-09 | Discharge: 2023-02-09 | Disposition: A | Discharge status: Home / Self Care | Payer: 59 | Source: Ambulatory Visit | Attending: Surgery | Admitting: Surgery

## 2023-02-09 ENCOUNTER — Other Ambulatory Visit: Payer: Self-pay

## 2023-02-09 ENCOUNTER — Ambulatory Visit (INDEPENDENT_AMBULATORY_CARE_PROVIDER_SITE_OTHER): Payer: 59 | Admitting: Surgery

## 2023-02-09 VITALS — BP 108/76 | HR 78 | Temp 98.0°F | Ht 66.5 in | Wt 175.0 lb

## 2023-02-09 DIAGNOSIS — R748 Abnormal levels of other serum enzymes: Secondary | ICD-10-CM

## 2023-02-09 DIAGNOSIS — R945 Abnormal results of liver function studies: Secondary | ICD-10-CM | POA: Diagnosis not present

## 2023-02-09 DIAGNOSIS — R1011 Right upper quadrant pain: Secondary | ICD-10-CM

## 2023-02-09 DIAGNOSIS — Z9049 Acquired absence of other specified parts of digestive tract: Secondary | ICD-10-CM | POA: Diagnosis not present

## 2023-02-09 DIAGNOSIS — R17 Unspecified jaundice: Secondary | ICD-10-CM

## 2023-02-09 MED ORDER — TRAMADOL-ACETAMINOPHEN 37.5-325 MG PO TABS
1.0000 | ORAL_TABLET | ORAL | 0 refills | Status: DC | PRN
  Filled 2023-02-09: qty 20, 2d supply, fill #0

## 2023-02-09 NOTE — Addendum Note (Signed)
Addended by: Sterling Big F on: 02/09/2023 11:31 AM   Modules accepted: Orders

## 2023-02-09 NOTE — Patient Instructions (Addendum)
You are scheduled for an MRCP at [facility] tomorrow (02/10/23). You will need to arrive at the [facility] entrance at 12:45 pm. You may have nothing to eat or drink for 4 hours prior.

## 2023-02-09 NOTE — Progress Notes (Signed)
Outpatient Surgical Follow Up  02/09/2023  Judith Mckinney is an 27 y.o. female.   Chief Complaint  Patient presents with   Follow-up    HPI: Judith Mckinney is 27 yo very nice female s/p cholecystectomy 7 months ago without any issue.  Last Sunday started having severe abdominal pain in the epigastric area radiated to the right upper quadrant.  This was very similar to prior biliary colic.  No fevers no chills decreased appetite.  Her pain now is better.  She did have lab work showing a normal CBC but a CMP with increase in the AST ALT as well as total bilirubin.  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  No evidence of cholangitis. Had an ultrasound today that I have personally reviewed showing a slight increase in the common bile duct up to 7 mm.  This is not definitive for retained stone or choledocholithiasis  Past Medical History:  Diagnosis Date   Biliary colic    GERD (gastroesophageal reflux disease)    Pre-eclampsia    Psoriasis    UTI (urinary tract infection) 07/07/2022   currently on Keflex tid    Past Surgical History:  Procedure Laterality Date   GALLBLADDER SURGERY     NO PAST SURGERIES      Family History  Problem Relation Age of Onset   Arthritis Mother    Hypertension Mother    Migraines Mother    Heart disease Father    Arthritis Sister    Anemia Sister     Social History:  reports that she quit smoking about 5 years ago. Her smoking use included cigarettes. She has a 4.00 pack-year smoking history. She has been exposed to tobacco smoke. She has never used smokeless tobacco. She reports current alcohol use. She reports that she does not use drugs.  Allergies:  Allergies  Allergen Reactions   Benzalkonium Chloride Rash   Neomycin-Bacitracin Zn-Polymyx Rash    Medications reviewed.    ROS Full ROS performed and is otherwise negative other than what is stated in HPI   BP 108/76   Pulse 78   Temp 98 F (36.7 C)   Ht 5' 6.5" (1.689 m)   Wt 175 lb (79.4 kg)    SpO2 98%   BMI 27.82 kg/m   Physical Exam Vitals and nursing note reviewed.  Constitutional:      Appearance: She is normal weight. She is not ill-appearing.  Eyes:     General: Scleral icterus present.     Pupils: Pupils are equal, round, and reactive to light.  Neck:     Vascular: No carotid bruit.  Cardiovascular:     Rate and Rhythm: Normal rate and regular rhythm.     Heart sounds: No murmur heard. Pulmonary:     Effort: Pulmonary effort is normal. No respiratory distress.     Breath sounds: Normal breath sounds. No stridor.  Abdominal:     General: Abdomen is flat. There is no distension.     Palpations: There is no mass.     Tenderness: There is abdominal tenderness. There is no guarding or rebound.     Hernia: No hernia is present.     Comments: Mild TTP RUQ and Epigastrium, no peritonitis  Musculoskeletal:        General: Normal range of motion.     Cervical back: Normal range of motion. No rigidity or tenderness.  Lymphadenopathy:     Cervical: No cervical adenopathy.  Skin:    General: Skin is warm  and dry.     Capillary Refill: Capillary refill takes less than 2 seconds.     Coloration: Skin is jaundiced.  Neurological:     General: No focal deficit present.     Mental Status: She is alert and oriented to person, place, and time.  Psychiatric:        Mood and Affect: Mood normal.        Behavior: Behavior normal.        Thought Content: Thought content normal.        Judgment: Judgment normal.      Results for orders placed or performed during the hospital encounter of 02/08/23 (from the past 48 hour(s))  Comprehensive Metabolic Panel (CMET)     Status: Abnormal   Collection Time: 02/08/23  3:59 PM  Result Value Ref Range   Sodium 136 135 - 145 mmol/L   Potassium 4.0 3.5 - 5.1 mmol/L   Chloride 105 98 - 111 mmol/L   CO2 26 22 - 32 mmol/L   Glucose, Bld 89 70 - 99 mg/dL    Comment: Glucose reference range applies only to samples taken after fasting  for at least 8 hours.   BUN 6 6 - 20 mg/dL   Creatinine, Ser 1.61 0.44 - 1.00 mg/dL   Calcium 8.7 (L) 8.9 - 10.3 mg/dL   Total Protein 7.1 6.5 - 8.1 g/dL   Albumin 3.9 3.5 - 5.0 g/dL   AST 096 (H) 15 - 41 U/L   ALT 306 (H) 0 - 44 U/L   Alkaline Phosphatase 124 38 - 126 U/L   Total Bilirubin 3.8 (H) 0.3 - 1.2 mg/dL   GFR, Estimated >04 >54 mL/min    Comment: (NOTE) Calculated using the CKD-EPI Creatinine Equation (2021)    Anion gap 10 5 - 15    Comment: Performed at [facility], [address] [place], [place] [address]  CBC     Status: None   Collection Time: 02/08/23  3:59 PM  Result Value Ref Range   WBC 5.9 4.0 - 10.5 K/uL   RBC 4.11 3.87 - 5.11 MIL/uL   Hemoglobin 12.6 12.0 - 15.0 g/dL   HCT 91.4 78.2 - 95.6 %   MCV 92.5 80.0 - 100.0 fL   MCH 30.7 26.0 - 34.0 pg   MCHC 33.2 30.0 - 36.0 g/dL   RDW 21.3 08.6 - 57.8 %   Platelets 265 150 - 400 K/uL   nRBC 0.0 0.0 - 0.2 %    Comment: Performed at [facility], [address] [place], [place] [address]   US ABDOMEN LIMITED RUQ (LIVER/GB)  Result Date: 02/09/2023 CLINICAL DATA:  Elevated liver enzymes EXAM: ULTRASOUND ABDOMEN LIMITED RIGHT UPPER QUADRANT COMPARISON:  07/07/2022 FINDINGS: Gallbladder: Prior cholecystectomy Common bile duct: Diameter: 7 mm Liver: No focal lesion identified. Increased hepatic parenchymal echogenicity. Portal vein is patent on color Doppler imaging with normal direction of blood flow towards the liver. Other: None. IMPRESSION: 1. Prior cholecystectomy. 2. Increased hepatic echogenicity as can be seen with hepatic steatosis. Electronically Signed   By: Elige Ko M.D.   On: 02/09/2023 10:03    Assessment/Plan: 27 year old female prior cholecystectomy 7 months ago now with epigastric and right upper quadrant pain and jaundice.  This is consistent with a retained stone.  Ultrasound does show slight increase in diameter of the common bile duct that is not necessarily definitive for  choledocholithiasis.  Will obtain a stat MRCP to confirm diagnosis of choledocholithiasis and  retained stone.  She in fact has choledocholithiasis she will require ERCP.  Discussed with her in detail about my thought process on her condition.  She is not septic she is not Nait[place] and she does not have evidence of cholangitis at this time.   Sterling Big, MD [facility] General Surgeon

## 2023-02-10 ENCOUNTER — Ambulatory Visit: Admission: RE | Admit: 2023-02-10 | Payer: 59 | Source: Ambulatory Visit

## 2023-02-10 ENCOUNTER — Telehealth: Payer: Self-pay

## 2023-02-10 ENCOUNTER — Other Ambulatory Visit: Payer: Self-pay | Admitting: Internal Medicine

## 2023-02-10 ENCOUNTER — Ambulatory Visit
Admission: RE | Admit: 2023-02-10 | Discharge: 2023-02-10 | Disposition: A | Discharge status: Home / Self Care | Payer: 59 | Source: Ambulatory Visit | Attending: Surgery | Admitting: Surgery

## 2023-02-10 DIAGNOSIS — R935 Abnormal findings on diagnostic imaging of other abdominal regions, including retroperitoneum: Secondary | ICD-10-CM | POA: Diagnosis not present

## 2023-02-10 DIAGNOSIS — R17 Unspecified jaundice: Secondary | ICD-10-CM | POA: Diagnosis not present

## 2023-02-10 DIAGNOSIS — G8929 Other chronic pain: Secondary | ICD-10-CM | POA: Diagnosis not present

## 2023-02-10 DIAGNOSIS — K838 Other specified diseases of biliary tract: Secondary | ICD-10-CM | POA: Diagnosis not present

## 2023-02-10 DIAGNOSIS — R1013 Epigastric pain: Secondary | ICD-10-CM | POA: Diagnosis not present

## 2023-02-10 DIAGNOSIS — R1011 Right upper quadrant pain: Secondary | ICD-10-CM | POA: Insufficient documentation

## 2023-02-10 DIAGNOSIS — R748 Abnormal levels of other serum enzymes: Secondary | ICD-10-CM

## 2023-02-10 MED ORDER — GADOBUTROL 1 MMOL/ML IV SOLN
7.5000 mL | Freq: Once | INTRAVENOUS | Status: AC | PRN
  Administered 2023-02-10: 7.5 mL via INTRAVENOUS

## 2023-02-10 NOTE — Telephone Encounter (Signed)
CMP ordered and pt notified.

## 2023-02-11 ENCOUNTER — Other Ambulatory Visit
Admission: RE | Admit: 2023-02-11 | Discharge: 2023-02-11 | Disposition: A | Discharge status: Home / Self Care | Payer: 59 | Source: Ambulatory Visit | Attending: Surgery | Admitting: Surgery

## 2023-02-11 DIAGNOSIS — R748 Abnormal levels of other serum enzymes: Secondary | ICD-10-CM | POA: Insufficient documentation

## 2023-02-11 LAB — COMPREHENSIVE METABOLIC PANEL
ALT: 183 U/L — ABNORMAL HIGH (ref 0–44)
AST: 95 U/L — ABNORMAL HIGH (ref 15–41)
Albumin: 3.9 g/dL (ref 3.5–5.0)
Alkaline Phosphatase: 140 U/L — ABNORMAL HIGH (ref 38–126)
Anion gap: 9 (ref 5–15)
BUN: 6 mg/dL (ref 6–20)
CO2: 22 mmol/L (ref 22–32)
Calcium: 9 mg/dL (ref 8.9–10.3)
Chloride: 101 mmol/L (ref 98–111)
Creatinine, Ser: 0.67 mg/dL (ref 0.44–1.00)
GFR, Estimated: >60 mL/min (ref >60)
Glucose, Bld: 91 mg/dL (ref 70–99)
Potassium: 3.8 mmol/L (ref 3.5–5.1)
Sodium: 132 mmol/L — ABNORMAL LOW (ref 135–145)
Total Bilirubin: 3.2 mg/dL — ABNORMAL HIGH (ref 0.3–1.2)
Total Protein: 7.7 g/dL (ref 6.5–8.1)

## 2023-02-16 ENCOUNTER — Ambulatory Visit (INDEPENDENT_AMBULATORY_CARE_PROVIDER_SITE_OTHER): Payer: 59 | Admitting: Surgery

## 2023-02-16 ENCOUNTER — Encounter: Payer: Self-pay | Admitting: Surgery

## 2023-02-16 VITALS — BP 118/73 | HR 69 | Temp 98.3°F | Wt 176.0 lb

## 2023-02-16 DIAGNOSIS — R748 Abnormal levels of other serum enzymes: Secondary | ICD-10-CM

## 2023-02-16 NOTE — Patient Instructions (Signed)
If you have any concerns or questions, please feel free to call our office.    

## 2023-02-18 ENCOUNTER — Other Ambulatory Visit
Admission: RE | Admit: 2023-02-18 | Discharge: 2023-02-18 | Disposition: A | Discharge status: Home / Self Care | Payer: 59 | Attending: Surgery | Admitting: Surgery

## 2023-02-18 DIAGNOSIS — R748 Abnormal levels of other serum enzymes: Secondary | ICD-10-CM | POA: Diagnosis not present

## 2023-02-18 LAB — COMPREHENSIVE METABOLIC PANEL
ALT: 107 U/L — ABNORMAL HIGH (ref 0–44)
AST: 46 U/L — ABNORMAL HIGH (ref 15–41)
Albumin: 3.7 g/dL (ref 3.5–5.0)
Alkaline Phosphatase: 84 U/L (ref 38–126)
Anion gap: 8 (ref 5–15)
BUN: 8 mg/dL (ref 6–20)
CO2: 25 mmol/L (ref 22–32)
Calcium: 9 mg/dL (ref 8.9–10.3)
Chloride: 108 mmol/L (ref 98–111)
Creatinine, Ser: 0.53 mg/dL (ref 0.44–1.00)
GFR, Estimated: >60 mL/min (ref >60)
Glucose, Bld: 91 mg/dL (ref 70–99)
Potassium: 4.2 mmol/L (ref 3.5–5.1)
Sodium: 141 mmol/L (ref 135–145)
Total Bilirubin: 1.2 mg/dL (ref 0.3–1.2)
Total Protein: 7.2 g/dL (ref 6.5–8.1)

## 2023-02-19 NOTE — Progress Notes (Signed)
Outpatient Surgical Follow Up  02/19/2023  Aleea E Mallet is an 27 y.o. female.   Chief Complaint  Patient presents with   Follow-up    RUQ pain    HPI:  27 year old female status post robotic cholecystectomy 7 months ago that was uneventful.  Episode of abdominal pain that has resolved. She did have some abdominal pain epigastrium and  Transient elevation of the LFTs . I have personally reviewed her MRCP showing no evidence of retained stone biliary leaks or complications related to surgery, cbd 8mm  Past Medical History:  Diagnosis Date   Biliary colic    GERD (gastroesophageal reflux disease)    Pre-eclampsia    Psoriasis    UTI (urinary tract infection) 07/07/2022   currently on Keflex tid    Past Surgical History:  Procedure Laterality Date   GALLBLADDER SURGERY      Family History  Problem Relation Age of Onset   Arthritis Mother    Hypertension Mother    Migraines Mother    Heart disease Father    Arthritis Sister    Anemia Sister     Social History:  reports that she quit smoking about 5 years ago. Her smoking use included cigarettes. She has a 4.00 pack-year smoking history. She has been exposed to tobacco smoke. She has never used smokeless tobacco. She reports current alcohol use. She reports that she does not use drugs.  Allergies:  Allergies  Allergen Reactions   Benzalkonium Chloride Rash   Neomycin-Bacitracin Zn-Polymyx Rash    Medications reviewed.    ROS Full ROS performed and is otherwise negative other than what is stated in HPI   BP 118/73   Pulse 69   Temp 98.3 F (36.8 C) (Oral)   Wt 176 lb (79.8 kg)   SpO2 98%   BMI 27.98 kg/m   Physical Exam Vitals and nursing note reviewed.  Constitutional:      General: She is not in acute distress.    Appearance: Normal appearance. She is not ill-appearing.  Pulmonary:     Effort: Pulmonary effort is normal.     Breath sounds: No stridor.  Abdominal:     General: Abdomen is flat.  There is no distension.     Palpations: Abdomen is soft. There is no mass.     Tenderness: There is no abdominal tenderness. There is no guarding or rebound.     Hernia: No hernia is present.  Skin:    General: Skin is warm and dry.     Capillary Refill: Capillary refill takes less than 2 seconds.     Coloration: Skin is not jaundiced.  Neurological:     General: No focal deficit present.     Mental Status: She is alert.  Psychiatric:        Mood and Affect: Mood normal.        Behavior: Behavior normal.        Thought Content: Thought content normal.        Judgment: Judgment normal.      Assessment/Plan: 27 year old female status post robotic cholecystectomy that was uneventful.  Episode of abdominal pain that has resolved.  Transient elevation of the LFTs and MRCP showing no evidence of retained stone biliary leaks or complications related to surgery.  I do think that she may have passed a stone to or my have a transient cholestatic process.  In any circumstance this has resolved.  We will follow her up with another CMP to ensure stability  of her liver enzymes and if there is a persistent issue we will refer to GI for further hepatic workup.  No need for hospitalization or surgical intervention at this time.  Sterling Big, MD [facility] General Surgeon

## 2023-03-01 ENCOUNTER — Other Ambulatory Visit: Payer: Self-pay | Admitting: Oncology

## 2023-03-01 DIAGNOSIS — Z006 Encounter for examination for normal comparison and control in clinical research program: Secondary | ICD-10-CM

## 2023-03-18 ENCOUNTER — Ambulatory Visit: Payer: 59 | Admitting: Nurse Practitioner

## 2023-04-08 ENCOUNTER — Encounter: Payer: Self-pay | Admitting: Nurse Practitioner

## 2023-04-08 ENCOUNTER — Other Ambulatory Visit: Payer: Self-pay

## 2023-04-08 ENCOUNTER — Ambulatory Visit (INDEPENDENT_AMBULATORY_CARE_PROVIDER_SITE_OTHER): Payer: 59 | Admitting: Nurse Practitioner

## 2023-04-08 VITALS — BP 110/68 | HR 100 | Temp 98.7°F | Resp 16 | Ht 67.0 in | Wt 187.2 lb

## 2023-04-08 DIAGNOSIS — Z1159 Encounter for screening for other viral diseases: Secondary | ICD-10-CM | POA: Diagnosis not present

## 2023-04-08 DIAGNOSIS — Z0000 Encounter for general adult medical examination without abnormal findings: Secondary | ICD-10-CM | POA: Diagnosis not present

## 2023-04-08 DIAGNOSIS — Z130 Encounter for screening for diseases of the blood and blood-forming organs and certain disorders involving the immune mechanism: Secondary | ICD-10-CM | POA: Diagnosis not present

## 2023-04-08 DIAGNOSIS — Z13220 Encounter for screening for lipoid disorders: Secondary | ICD-10-CM | POA: Diagnosis not present

## 2023-04-08 DIAGNOSIS — Z131 Encounter for screening for diabetes mellitus: Secondary | ICD-10-CM

## 2023-04-08 NOTE — Progress Notes (Signed)
Name: Judith Mckinney   MRN: [id number]    DOB: 11-12-1995   Date:04/08/2023       Progress Note  Subjective  Chief Complaint  Chief Complaint  Patient presents with   Annual Exam    HPI  Patient presents for annual CPE.  Diet: Regular, tries to eat well balanced Exercise: 1 day 20 minutes, recommend 150 min of physical activity weekly   Last Eye Exam: 2024 Last Dental Exam: 2022  Flowsheet Row Office Visit from 04/08/2023 in [facility]  AUDIT-C Score 0      Depression: Phq 9 is  negative    04/08/2023    3:30 PM 11/19/2022    8:48 AM 09/08/2022    2:35 PM 08/06/2022    8:57 AM  Depression screen PHQ 2/9  Decreased Interest 0 0 0 0  Down, Depressed, Hopeless 0 0 0 0  PHQ - 2 Score 0 0 0 0   Hypertension: BP Readings from Last 3 Encounters:  04/08/23 110/68  02/16/23 118/73  02/09/23 108/76   Obesity: Wt Readings from Last 3 Encounters:  04/08/23 187 lb 3.2 oz (84.9 kg)  02/16/23 176 lb (79.8 kg)  02/09/23 175 lb (79.4 kg)   BMI Readings from Last 3 Encounters:  04/08/23 29.32 kg/m  02/16/23 27.98 kg/m  02/09/23 27.82 kg/m     Vaccines:  HPV: up to at age 36 , ask insurance if age between 97-45  Shingrix: 51-64 yo and ask insurance if covered when patient above 54 yo Pneumonia:  educated and discussed with patient. Flu:  educated and discussed with patient.     Hep C Screening: ordered STD testing and prevention (HIV/chl/gon/syphilis): ordered Intimate partner violence: negative screen  Sexual History : yes Menstrual History/LMP/Abnormal Bleeding: LMC: IUD Discussed importance of follow up if any post-menopausal bleeding: yes  Incontinence Symptoms: negative for symptoms   Breast cancer:  - Last Mammogram: NA - BRCA gene screening: none  Osteoporosis Prevention : Discussed high calcium and vitamin D supplementation, weight bearing exercises Bone density :not applicable   Cervical cancer screening: due, she is going  to go to her GYN  Skin cancer: Discussed monitoring for atypical lesions  Colorectal cancer: NA   Lung cancer:  Low Dose CT Chest recommended if Age 32-80 years, 20 pack-year currently smoking OR have quit w/in 15years. Patient does not qualify for screen   ECG: 07/12/2022  Advanced Care Planning: A voluntary discussion about advance care planning including the explanation and discussion of advance directives.  Discussed health care proxy and Living will, and the patient was able to identify a health care proxy as husband.  Patient does not have a living will and power of attorney of health care   Lipids: No results found for: "CHOL" No results found for: "HDL" No results found for: "LDLCALC" No results found for: "TRIG" No results found for: "CHOLHDL" No results found for: "LDLDIRECT"  Glucose: Glucose  Date Value Ref Range Status  08/23/2014 78 65 - 99 mg/dL Final  40/98/1191 79 65 - 99 mg/dL Final   Glucose, Bld  Date Value Ref Range Status  02/18/2023 91 70 - 99 mg/dL Final    Comment:    Glucose reference range applies only to samples taken after fasting for at least 8 hours.  02/11/2023 91 70 - 99 mg/dL Final    Comment:    Glucose reference range applies only to samples taken after fasting for at least 8 hours.  02/08/2023  89 70 - 99 mg/dL Final    Comment:    Glucose reference range applies only to samples taken after fasting for at least 8 hours.    Patient Active Problem List   Diagnosis Date Noted   Class 1 obesity due to excess calories with serious comorbidity and body mass index (BMI) of 32.0 to 32.9 in adult 08/06/2022   Migraine with aura and without status migrainosus, not intractable 08/06/2022   Psoriasis 04/08/2014    Past Surgical History:  Procedure Laterality Date   GALLBLADDER SURGERY      Family History  Problem Relation Age of Onset   Arthritis Mother    Hypertension Mother    Migraines Mother    Heart disease Father    Arthritis Sister     Anemia Sister     Social History   Socioeconomic History   Marital status: Married    Spouse name: Not on file   Number of children: Not on file   Years of education: Not on file   Highest education level: Associate degree: occupational, Scientist, product/process development, or vocational program  Occupational History   Not on file  Tobacco Use   Smoking status: Former    Current packs/day: 0.00    Average packs/day: 0.5 packs/day for 8.0 years (4.0 ttl pk-yrs)    Types: Cigarettes    Start date: 2011    Quit date: 2019    Years since quitting: 5.6    Passive exposure: Past   Smokeless tobacco: Never  Vaping Use   Vaping status: Every Day   Substances: Nicotine, Flavoring  Substance and Sexual Activity   Alcohol use: Yes    Comment: wine occ   Drug use: No   Sexual activity: Yes  Other Topics Concern   Not on file  Social History Narrative   Works for [employer] at [employer] since October   Social Determinants of Health   Financial Resource Strain: Low Risk  (04/08/2023)   Overall Financial Resource Strain (CARDIA)    Difficulty of Paying Living Expenses: Not hard at all  Food Insecurity: No Food Insecurity (04/08/2023)   Hunger Vital Sign    Worried About Running Out of Food in the Last Year: Never true    Ran Out of Food in the Last Year: Never true  Transportation Needs: No Transportation Needs (04/08/2023)   PRAPARE - Administrator, Civil Service (Medical): No    Lack of Transportation (Non-Medical): No  Physical Activity: Insufficiently Active (04/08/2023)   Exercise Vital Sign    Days of Exercise per Week: 1 day    Minutes of Exercise per Session: 20 min  Stress: No Stress Concern Present (04/08/2023)   [employer] of Occupational Health - Occupational Stress Questionnaire    Feeling of Stress : Only a little  Social Connections: Socially Integrated (04/08/2023)   Social Connection and Isolation Panel [NHANES]    Frequency of Communication with Friends  and Family: More than three times a week    Frequency of Social Gatherings with Friends and Family: More than three times a week    Attends Religious Services: More than 4 times per year    Active Member of [employer] or Organizations: Yes    Attends Banker Meetings: 1 to 4 times per year    Marital Status: Married  Catering manager Violence: Not At Risk (04/08/2023)   Humiliation, Afraid, Rape, and Kick questionnaire    Fear of Current or Ex-Partner: No  Emotionally Abused: No    Physically Abused: No    Sexually Abused: No     Current Outpatient Medications:    SUMAtriptan (IMITREX) 25 MG tablet, Take 1 tablet (25 mg total) by mouth every 2 (two) hours as needed for migraine. May repeat in 2 hours if headache persists or recurs., Disp: 9 tablet, Rfl: 0   tretinoin (RETIN-A) 0.05 % cream, Apply 1 Application topically at bedtime., Disp: 45 g, Rfl: 3   levonorgestrel (MIRENA, 52 MG,) 20 MCG/DAY IUD, 1 each by Intrauterine route once. (Patient not taking: Reported on 04/08/2023), Disp: , Rfl:   Allergies  Allergen Reactions   Benzalkonium Chloride Rash   Neomycin-Bacitracin Zn-Polymyx Rash     ROS  Constitutional: Negative for fever or weight change.  Respiratory: Negative for cough and shortness of breath.   Cardiovascular: Negative for chest pain or palpitations.  Gastrointestinal: Negative for abdominal pain, no bowel changes.  Musculoskeletal: Negative for gait problem or joint swelling.  Skin: Negative for rash.  Neurological: Negative for dizziness or headache.  No other specific complaints in a complete review of systems (except as listed in HPI above).   Objective  Vitals:   04/08/23 1530  BP: 110/68  Pulse: 100  Resp: 16  Temp: 98.7 F (37.1 C)  TempSrc: Oral  SpO2: 98%  Weight: 187 lb 3.2 oz (84.9 kg)  Height: 5\' 7"  (1.702 m)    Body mass index is 29.32 kg/m.  Physical Exam Constitutional: Patient appears well-developed and well-nourished. No  distress.  HENT: Head: Normocephalic and atraumatic. Ears: B TMs ok, no erythema or effusion; Nose: Nose normal. Mouth/Throat: Oropharynx is clear and moist. No oropharyngeal exudate.  Eyes: Conjunctivae and EOM are normal. Pupils are equal, round, and reactive to light. No scleral icterus.  Neck: Normal range of motion. Neck supple. No JVD present. No thyromegaly present.  Cardiovascular: Normal rate, regular rhythm and normal heart sounds.  No murmur heard. No BLE edema. Pulmonary/Chest: Effort normal and breath sounds normal. No respiratory distress. Abdominal: Soft. Bowel sounds are normal, no distension. There is no tenderness. no masses Musculoskeletal: Normal range of motion, no joint effusions. No gross deformities Neurological: he is alert and oriented to person, place, and time. No cranial nerve deficit. Coordination, balance, strength, speech and gait are normal.  Skin: Skin is warm and dry. No rash noted. No erythema.  Psychiatric: Patient has a normal mood and affect. behavior is normal. Judgment and thought content normal.   Recent Results (from the past 2160 hour(s))  Comprehensive Metabolic Panel (CMET)     Status: Abnormal   Collection Time: 02/08/23  3:59 PM  Result Value Ref Range   Sodium 136 135 - 145 mmol/L   Potassium 4.0 3.5 - 5.1 mmol/L   Chloride 105 98 - 111 mmol/L   CO2 26 22 - 32 mmol/L   Glucose, Bld 89 70 - 99 mg/dL    Comment: Glucose reference range applies only to samples taken after fasting for at least 8 hours.   BUN 6 6 - 20 mg/dL   Creatinine, Ser 1.61 0.44 - 1.00 mg/dL   Calcium 8.7 (L) 8.9 - 10.3 mg/dL   Total Protein 7.1 6.5 - 8.1 g/dL   Albumin 3.9 3.5 - 5.0 g/dL   AST 096 (H) 15 - 41 U/L   ALT 306 (H) 0 - 44 U/L   Alkaline Phosphatase 124 38 - 126 U/L   Total Bilirubin 3.8 (H) 0.3 - 1.2 mg/dL   GFR,  Estimated >60 >60 mL/min    Comment: (NOTE) Calculated using the CKD-EPI Creatinine Equation (2021)    Anion gap 10 5 - 15    Comment:  Performed at [facility], [address] [place], [place] [address]  CBC     Status: None   Collection Time: 02/08/23  3:59 PM  Result Value Ref Range   WBC 5.9 4.0 - 10.5 K/uL   RBC 4.11 3.87 - 5.11 MIL/uL   Hemoglobin 12.6 12.0 - 15.0 g/dL   HCT 95.6 21.3 - 08.6 %   MCV 92.5 80.0 - 100.0 fL   MCH 30.7 26.0 - 34.0 pg   MCHC 33.2 30.0 - 36.0 g/dL   RDW 57.8 46.9 - 62.9 %   Platelets 265 150 - 400 K/uL   nRBC 0.0 0.0 - 0.2 %    Comment: Performed at [facility], [address] [place], [place] [address]  Comprehensive Metabolic Panel (CMET)     Status: Abnormal   Collection Time: 02/11/23  8:44 AM  Result Value Ref Range   Sodium 132 (L) 135 - 145 mmol/L   Potassium 3.8 3.5 - 5.1 mmol/L   Chloride 101 98 - 111 mmol/L   CO2 22 22 - 32 mmol/L   Glucose, Bld 91 70 - 99 mg/dL    Comment: Glucose reference range applies only to samples taken after fasting for at least 8 hours.   BUN 6 6 - 20 mg/dL   Creatinine, Ser 3.24 0.44 - 1.00 mg/dL   Calcium 9.0 8.9 - 40.1 mg/dL   Total Protein 7.7 6.5 - 8.1 g/dL   Albumin 3.9 3.5 - 5.0 g/dL   AST 95 (H) 15 - 41 U/L   ALT 183 (H) 0 - 44 U/L   Alkaline Phosphatase 140 (H) 38 - 126 U/L   Total Bilirubin 3.2 (H) 0.3 - 1.2 mg/dL   GFR, Estimated >02 >72 mL/min    Comment: (NOTE) Calculated using the CKD-EPI Creatinine Equation (2021)    Anion gap 9 5 - 15    Comment: Performed at [facility], [address] [place], [place] [address]  Comprehensive metabolic panel     Status: Abnormal   Collection Time: 02/18/23 12:26 PM  Result Value Ref Range   Sodium 141 135 - 145 mmol/L   Potassium 4.2 3.5 - 5.1 mmol/L   Chloride 108 98 - 111 mmol/L   CO2 25 22 - 32 mmol/L   Glucose, Bld 91 70 - 99 mg/dL    Comment: Glucose reference range applies only to samples taken after fasting for at least 8 hours.   BUN 8 6 - 20 mg/dL   Creatinine, Ser 4.03 0.44 - 1.00 mg/dL   Calcium 9.0 8.9 - 47.4 mg/dL   Total Protein  7.2 6.5 - 8.1 g/dL   Albumin 3.7 3.5 - 5.0 g/dL   AST 46 (H) 15 - 41 U/L   ALT 107 (H) 0 - 44 U/L   Alkaline Phosphatase 84 38 - 126 U/L   Total Bilirubin 1.2 0.3 - 1.2 mg/dL   GFR, Estimated >25 >95 mL/min    Comment: (NOTE) Calculated using the CKD-EPI Creatinine Equation (2021)    Anion gap 8 5 - 15    Comment: Performed at [facility], [address] [place], [place] [address]     Fall Risk:    04/08/2023    3:29 PM 11/19/2022    8:48 AM 09/08/2022    2:35 PM  08/06/2022    8:57 AM  Fall Risk   Falls in the past year? 0 0 0 0  Number falls in past yr: 0 0 0 0  Injury with Fall? 0 0 0 0  Follow up   Falls evaluation completed Falls evaluation completed     Functional Status Survey: Is the patient deaf or have difficulty hearing?: No Does the patient have difficulty seeing, even when wearing glasses/contacts?: No Does the patient have difficulty concentrating, remembering, or making decisions?: No Does the patient have difficulty walking or climbing stairs?: No Does the patient have difficulty dressing or bathing?: No Does the patient have difficulty doing errands alone such as visiting a doctor's office or shopping?: No   Assessment & Plan  1. Annual physical exam recommend 150 min of physical activity weekly   - CBC with Differential/Platelet - COMPLETE METABOLIC PANEL WITH GFR - Lipid panel - Hemoglobin A1c - Hepatitis C antibody  2. Screening for diabetes mellitus  - COMPLETE METABOLIC PANEL WITH GFR - Hemoglobin A1c  3. Screening for deficiency anemia  - CBC with Differential/Platelet  4. Screening for cholesterol level  - Lipid panel  5. Encounter for hepatitis C screening test for low risk patient  - Hepatitis C antibody   -USPSTF grade A and B recommendations reviewed with patient; age-appropriate recommendations, preventive care, screening tests, etc discussed and encouraged; healthy living encouraged; see AVS for patient education  given to patient -Discussed importance of 150 minutes of physical activity weekly, eat two servings of fish weekly, eat one serving of tree nuts ( cashews, pistachios, pecans, almonds.Marland Kitchen) every other day, eat 6 servings of fruit/vegetables daily and drink plenty of water and avoid sweet beverages.   -Reviewed Health Maintenance: Yes.

## 2023-04-09 LAB — CBC WITH DIFFERENTIAL/PLATELET
Absolute Monocytes: 520 {cells}/uL (ref 200–950)
Basophils Absolute: 24 {cells}/uL (ref 0–200)
Basophils Relative: 0.3 %
Eosinophils Absolute: 160 {cells}/uL (ref 15–500)
Eosinophils Relative: 2 %
HCT: 39.2 % (ref 35.0–45.0)
Hemoglobin: 13.2 g/dL (ref 11.7–15.5)
Lymphs Abs: 1864 {cells}/uL (ref 850–3900)
MCH: 30.3 pg (ref 27.0–33.0)
MCHC: 33.7 g/dL (ref 32.0–36.0)
MCV: 90.1 fL (ref 80.0–100.0)
MPV: 10 fL (ref 7.5–12.5)
Monocytes Relative: 6.5 %
Neutro Abs: 5432 {cells}/uL (ref 1500–7800)
Neutrophils Relative %: 67.9 %
Platelets: 296 10*3/uL (ref 140–400)
RBC: 4.35 10*6/uL (ref 3.80–5.10)
RDW: 11.9 % (ref 11.0–15.0)
Total Lymphocyte: 23.3 %
WBC: 8 10*3/uL (ref 3.8–10.8)

## 2023-04-09 LAB — COMPLETE METABOLIC PANEL WITH GFR
AG Ratio: 1.6 (calc) (ref 1.0–2.5)
ALT: 9 U/L (ref 6–29)
AST: 16 U/L (ref 10–30)
Albumin: 4.4 g/dL (ref 3.6–5.1)
Alkaline phosphatase (APISO): 52 U/L (ref 31–125)
BUN: 9 mg/dL (ref 7–25)
CO2: 24 mmol/L (ref 20–32)
Calcium: 9.6 mg/dL (ref 8.6–10.2)
Chloride: 106 mmol/L (ref 98–110)
Creat: 0.63 mg/dL (ref 0.50–0.96)
Globulin: 2.8 g/dL (ref 1.9–3.7)
Glucose, Bld: 99 mg/dL (ref 65–99)
Potassium: 3.9 mmol/L (ref 3.5–5.3)
Sodium: 138 mmol/L (ref 135–146)
Total Bilirubin: 0.9 mg/dL (ref 0.2–1.2)
Total Protein: 7.2 g/dL (ref 6.1–8.1)
eGFR: 124 mL/min/{1.73_m2} (ref >=60)

## 2023-04-09 LAB — HEMOGLOBIN A1C
Hgb A1c MFr Bld: 5.1 %{Hb} (ref <5.7)
Mean Plasma Glucose: 100 mg/dL
eAG (mmol/L): 5.5 mmol/L

## 2023-04-09 LAB — LIPID PANEL
Cholesterol: 174 mg/dL (ref <200)
HDL: 61 mg/dL (ref >=50)
LDL Cholesterol (Calc): 96 mg/dL
Non-HDL Cholesterol (Calc): 113 mg/dL (ref <130)
Total CHOL/HDL Ratio: 2.9 (calc) (ref <5.0)
Triglycerides: 78 mg/dL (ref <150)

## 2023-04-09 LAB — HEPATITIS C ANTIBODY: Hepatitis C Ab: NONREACTIVE

## 2024-06-11 ENCOUNTER — Other Ambulatory Visit: Payer: Self-pay | Admitting: Genetic Counselor

## 2024-06-11 DIAGNOSIS — Z006 Encounter for examination for normal comparison and control in clinical research program: Secondary | ICD-10-CM
